# Patient Record
Sex: Male | Born: 1940 | Race: White | Hispanic: No | Marital: Married | Smoking: Never smoker
Health system: Southern US, Community
[De-identification: ages and names within clinical notes are randomized; demographics above are authoritative.]

## PROBLEM LIST (undated history)

## (undated) DIAGNOSIS — E1159 Type 2 diabetes mellitus with other circulatory complications: Secondary | ICD-10-CM

## (undated) DIAGNOSIS — I10 Essential (primary) hypertension: Secondary | ICD-10-CM

## (undated) DIAGNOSIS — E119 Type 2 diabetes mellitus without complications: Secondary | ICD-10-CM

## (undated) DIAGNOSIS — J302 Other seasonal allergic rhinitis: Secondary | ICD-10-CM

## (undated) DIAGNOSIS — N189 Chronic kidney disease, unspecified: Secondary | ICD-10-CM

## (undated) DIAGNOSIS — I152 Hypertension secondary to endocrine disorders: Secondary | ICD-10-CM

## (undated) DIAGNOSIS — G43909 Migraine, unspecified, not intractable, without status migrainosus: Secondary | ICD-10-CM

## (undated) DIAGNOSIS — Z9841 Cataract extraction status, right eye: Secondary | ICD-10-CM

## (undated) DIAGNOSIS — E785 Hyperlipidemia, unspecified: Secondary | ICD-10-CM

## (undated) DIAGNOSIS — H35379 Puckering of macula, unspecified eye: Secondary | ICD-10-CM

## (undated) DIAGNOSIS — H43812 Vitreous degeneration, left eye: Secondary | ICD-10-CM

## (undated) HISTORY — DX: Puckering of macula, unspecified eye: H35.379

## (undated) HISTORY — DX: Cataract extraction status, right eye: Z98.41

## (undated) HISTORY — DX: Hypertension secondary to endocrine disorders: I15.2

## (undated) HISTORY — DX: Other seasonal allergic rhinitis: J30.2

## (undated) HISTORY — DX: Type 2 diabetes mellitus without complications: E11.9

## (undated) HISTORY — DX: Vitreous degeneration, left eye: H43.812

## (undated) HISTORY — PX: OTHER SURGICAL HISTORY: SHX169

## (undated) HISTORY — DX: Type 2 diabetes mellitus with other circulatory complications: E11.59

## (undated) HISTORY — DX: Hyperlipidemia, unspecified: E78.5

## (undated) HISTORY — DX: Migraine, unspecified, not intractable, without status migrainosus: G43.909

## (undated) HISTORY — PX: TONSILLECTOMY AND ADENOIDECTOMY: SUR1326

## (undated) HISTORY — PX: TONSILLECTOMY: SUR1361

## (undated) HISTORY — PX: CATARACT EXTRACTION, BILATERAL: SHX1313

## (undated) HISTORY — DX: Essential (primary) hypertension: I10

## (undated) HISTORY — PX: TOOTH EXTRACTION: SUR596

## (undated) HISTORY — DX: Chronic kidney disease, unspecified: N18.9

---

## 2016-06-06 ENCOUNTER — Encounter: Payer: Self-pay | Admitting: *Deleted

## 2016-06-06 ENCOUNTER — Encounter: Payer: Medicare Other | Attending: Internal Medicine | Admitting: *Deleted

## 2016-06-06 VITALS — BP 108/70 | Ht 71.0 in | Wt 155.4 lb

## 2016-06-06 DIAGNOSIS — E119 Type 2 diabetes mellitus without complications: Secondary | ICD-10-CM | POA: Diagnosis not present

## 2016-06-06 DIAGNOSIS — Z794 Long term (current) use of insulin: Secondary | ICD-10-CM

## 2016-06-06 DIAGNOSIS — Z713 Dietary counseling and surveillance: Secondary | ICD-10-CM | POA: Diagnosis not present

## 2016-06-06 DIAGNOSIS — Z6821 Body mass index (BMI) 21.0-21.9, adult: Secondary | ICD-10-CM | POA: Diagnosis not present

## 2016-06-06 NOTE — Progress Notes (Signed)
Diabetes Self-Management Education  Visit Type: First/Initial  Appt. Start Time: 1335 Appt. End Time: 1520  06/06/2016  [name], identified by name and date of birth, is a 75 y.o. male with a diagnosis of Diabetes: Type 2.   ASSESSMENT  Blood pressure 108/70, height 5\' 11"  (1.803 m), weight 155 lb 6.4 oz (70.5 kg). Body mass index is 21.67 kg/m.      Diabetes Self-Management Education - 06/06/16 1543      Visit Information   Visit Type First/Initial     Initial Visit   Diabetes Type Type 2   Are you currently following a meal plan? Yes   What type of meal plan do you follow? 3 meals/3 snacks per day - 2400 calorie diet   Are you taking your medications as prescribed? Yes   Date Diagnosed 23 years ago     Health Coping   How would you rate your overall health? Good     Psychosocial Assessment   Patient Belief/Attitude about Diabetes Motivated to manage diabetes   Self-care barriers None   Self-management support Doctor's office;Family   Other persons present Spouse/SO   Patient Concerns Nutrition/Meal planning;Medication;Problem Solving;Monitoring;Glycemic Control   Special Needs None   Preferred Learning Style Auditory;Visual;Hands on   [place] in progress   How often do you need to have someone help you when you read instructions, pamphlets, or other written materials from your doctor or pharmacy? 1 - Never   What is the last grade level you completed in school? 4 years college     Pre-Education Assessment   Patient understands the diabetes disease and treatment process. Needs Review   Patient understands incorporating nutritional management into lifestyle. Needs Review   Patient undertands incorporating physical activity into lifestyle. Demonstrates understanding / competency   Patient understands using medications safely. Needs Review   Patient understands monitoring blood glucose, interpreting and using results Needs Review   Patient  understands prevention, detection, and treatment of acute complications. Needs Review   Patient understands prevention, detection, and treatment of chronic complications. Needs Review   Patient understands how to develop strategies to address psychosocial issues. Needs Review   Patient understands how to develop strategies to promote health/change behavior. Needs Review     Complications   Last HgB A1C per patient/outside source 7.4 %  03/30/16   How often do you check your blood sugar? 1-2 times/day   Fasting Blood glucose range (mg/dL) 70-129  Pt reports FBG's 80-130 mg/dL   Postprandial Blood glucose range (mg/dL) 70-129;130-179  Pt reports pre-lunch and pre-supper 80-190's mg/dL.    Have you had a dilated eye exam in the past 12 months? Yes   Have you had a dental exam in the past 12 months? Yes   Are you checking your feet? Yes   How many days per week are you checking your feet? 7     Dietary Intake   Breakfast oatmeal with fruit, milk and nuts; cereal and milk with nuts   Snack (morning) fruit, crackers   Lunch tomato sandwich, cuccumbers, onions, bread, pork   Snack (afternoon) fruit, cereal and milk   Dinner tenderloin, bread, okra, no sugar ice cream   Snack (evening) oatmeal, cereal and milk, fruit, peanuts   Beverage(s) water, diet soda     Exercise   Exercise Type Moderate (swimming / aerobic walking);Light (walking / raking leaves)  walking, weight machine, swimming   How many days per week to you exercise? 6  How many minutes per day do you exercise? 50   Total minutes per week of exercise 300     Patient Education   Previous Diabetes Education Yes (please comment)  1997 at [place] [facility]  Explored patient's options for treatment of their diabetes   Nutrition management  Carbohydrate counting;Food label reading, portion sizes and measuring food.;Reviewed blood glucose goals for pre and post meals and how to evaluate the patients' food  intake on their blood glucose level.   Physical activity and exercise  Role of exercise on diabetes management, blood pressure control and cardiac health.   Medications Taught/reviewed insulin injection, site rotation, insulin storage and needle disposal.;Reviewed patients medication for diabetes, action, purpose, timing of dose and side effects.   Monitoring Taught/discussed recording of test results and interpretation of SMBG.;Identified appropriate SMBG and/or A1C goals.   Acute complications Taught treatment of hypoglycemia - the 15 rule.   Chronic complications Nephropathy, what it is, prevention of, the use of ACE, ARB's and early detection of through urine microalbumia.   Psychosocial adjustment Identified and addressed patients feelings and concerns about diabetes     Individualized Goals (developed by patient)   Reducing Risk Improve blood sugars Decrease medications Prevent diabetes complications     Outcomes   Expected Outcomes Demonstrated interest in learning. Expect positive outcomes   Future DMSE 2 wks      Individualized Plan for Diabetes Self-Management Training:   Learning Objective:  Patient will have a greater understanding of diabetes self-management. Patient education plan is to attend individual and/or group sessions per assessed needs and concerns.   Plan:   Patient Instructions  Check blood sugars 2 x day before breakfast and 2 hrs after a meal every day Exercise: Continue program for  45-60  minutes  5-7 days a week Eat 3 meals day,  2-3  snacks a day Space meals 4-6 hours apart Complete 3 Day Food Record and bring to next appt Bring blood sugar records to the next appointment Carry fast acting glucose and a snack at all times Return for appointment  Tuesday June 21, 2016 at 1:30 with Jaclyn Shaggy (dietitian)   Expected Outcomes:  Demonstrated interest in learning. Expect positive outcomes  Education material provided:  General Meal Planning  Guidelines Simple Meal Plan 3 Day Food Record Glucose tablets Symptoms, causes and treatments of Hypoglycemia  If problems or questions, patient to contact team via:   Johny Drilling, [place], [place], CDE [phone]  Future DSME appointment: 2 wks   Tuesday June 21, 2016 with dietitian

## 2016-06-06 NOTE — Patient Instructions (Addendum)
Check blood sugars 2 x day before breakfast and 2 hrs after a meal every day  Exercise: Continue program for  45-60  minutes  5-7 days a week  Eat 3 meals day,  2-3  snacks a day Space meals 4-6 hours apart Complete 3 Day Food Record and bring to next appt  Bring blood sugar records to the next appointment  Carry fast acting glucose and a snack at all times  Return for appointment  Tuesday June 21, 2016 at 1:30 with Jaclyn Shaggy (dietitian)

## 2016-06-21 ENCOUNTER — Encounter: Payer: Self-pay | Admitting: Dietician

## 2016-06-21 ENCOUNTER — Encounter: Payer: Medicare Other | Attending: Internal Medicine | Admitting: Dietician

## 2016-06-21 VITALS — BP 122/72 | Ht 71.0 in | Wt 153.0 lb

## 2016-06-21 DIAGNOSIS — Z713 Dietary counseling and surveillance: Secondary | ICD-10-CM | POA: Diagnosis present

## 2016-06-21 DIAGNOSIS — Z6821 Body mass index (BMI) 21.0-21.9, adult: Secondary | ICD-10-CM | POA: Diagnosis not present

## 2016-06-21 DIAGNOSIS — Z794 Long term (current) use of insulin: Secondary | ICD-10-CM

## 2016-06-21 DIAGNOSIS — E119 Type 2 diabetes mellitus without complications: Secondary | ICD-10-CM

## 2016-06-21 NOTE — Progress Notes (Signed)
Diabetes Self-Management Education  Visit Type:  Follow-up  Appt. Start Time: 1330 Appt. End Time: 1430  06/21/2016  [name], identified by name and date of birth, is a 75 y.o. male with a diagnosis of Diabetes:  Marland Kitchen  Type 2 Patient states that his present weight is his preferred weight. He reports he has followed a 2400 calorie diet since his initial diagnosis approximately 23 years ago. He has experienced decreased appetite since starting the medication, trulicity and he has lost 2 lbs since his initial visit 2 weeks ago. He brought food records which indicate his carbohydrate servings are within recommended ranges. He often eats 2 servings of fruit with meals as well as including fruit with his cereal; sometimes 2 fruits. His FBG's are within recommended ranges but many of his post meal readings are greater than 200. He reports if he is active after meals, the numbers are usually lower. In general, he does a nice job of balancing his meals. Reviewed his meals, making suggestions on balance of types of carbohydrate and also reviewed his meal plan in general. ASSESSMENT  Blood pressure 122/72, height 5\' 11"  (1.803 m), weight 153 lb (69.4 kg). Body mass index is 21.34 kg/m.       Diabetes Self-Management Education - [id number]      Complications   Last HgB A1C per patient/outside source 7.4 %   How often do you check your blood sugar? 3-4 times/day   Fasting Blood glucose range (mg/dL) 70-129   Postprandial Blood glucose range (mg/dL) 130-179;180-200;>200   Have you had a dilated eye exam in the past 12 months? Yes   Have you had a dental exam in the past 12 months? Yes   Are you checking your feet? Yes   How many days per week are you checking your feet? 7     Dietary Intake   Breakfast 1 cup 1%milk, 1 cup oatmeal, 4 strawberries, 1/2 banana, 2 Tbsp unsalted peanuts or 1 cup cereal/milk, 1-2 fruits, 2tbsp peanuts   Snack (morning) 1/2 apple, 6-8 ritz crackers or 1/2 med.  apple, 4 crackers   Lunch Ex. 1 (4oz) pork chop, 1/2 cup black beans, 2 cups cantaloupe, 1 homemade biscuit, 1 tomato, 1/2 banana    Snack (afternoon) 6 crackers, peanut butter or string cheese, pretzels   Dinner baked salmon, 3 small pieces cornbread, 1/2 cup cabbage, 3 Tbsp reduced sugar preserves   Snack (evening) 1 piece bread, cheese or 1.5 cups popcorn, 1/4 cup English walnuts   Beverage(s) water-6-8 cups per day; 12 oz. diet soda, 2 x/day     Exercise   Exercise Type Light (walking / raking leaves)   How many days per week to you exercise? 6   How many minutes per day do you exercise? 45   Total minutes per week of exercise 270     Patient Education   Nutrition management  Role of diet in the treatment of diabetes and the relationship between the three main macronutrients and blood glucose level;Food label reading, portion sizes and measuring food.;Carbohydrate counting;Reviewed blood glucose goals for pre and post meals and how to evaluate the patients' food intake on their blood glucose level.;Meal options for control of blood glucose level and chronic complications.     Post-Education Assessment   Patient understands the diabetes disease and treatment process. Demonstrates understanding / competency   Patient understands incorporating nutritional management into lifestyle. Demonstrates understanding / competency   Patient undertands incorporating physical activity into lifestyle.  Demonstrates understanding / competency   Patient understands using medications safely. Demonstrates understanding / competency   Patient understands monitoring blood glucose, interpreting and using results Demonstrates understanding / competency   Patient understands prevention, detection, and treatment of acute complications. Demonstrates understanding / competency   Patient understands prevention, detection, and treatment of chronic complications. Demonstrates understanding / competency   Patient  understands how to develop strategies to address psychosocial issues. Demonstrates understanding / competency   Patient understands how to develop strategies to promote health/change behavior. Demonstrates understanding / competency     Outcomes   Program Status Completed      Learning Objective:  Patient will have a greater understanding of diabetes self-management. Patient education plan is to attend individual and/or group sessions per assessed needs and concerns.   Plan:  To include only one fruit serving per meal and to avoid dried fruit. No fruit when eating dry cereal/milk. To average 4 servings of carbohydrate per meal with 1-2 servings of carbohydrate for snacks. To include a protein food if having a fruit for a snack. If loses any more weight, to be sure each snack has 2 servings of carbohydrate + protein. Also, to add more healthy fats to meals.   Expected Outcomes:  Demonstrated interest in learning. Expect positive outcomes  Education material provided:Planning a Balanced Meal If problems or questions, patient to contact team via:  Karolee Stamps, RD,CDE  [phone]  Future DSME appointment: - PRN

## 2016-08-24 ENCOUNTER — Encounter (INDEPENDENT_AMBULATORY_CARE_PROVIDER_SITE_OTHER): Payer: Self-pay

## 2016-08-24 ENCOUNTER — Ambulatory Visit (INDEPENDENT_AMBULATORY_CARE_PROVIDER_SITE_OTHER): Payer: Medicare Other | Admitting: Cardiovascular Disease

## 2016-08-24 ENCOUNTER — Encounter: Payer: Self-pay | Admitting: Cardiovascular Disease

## 2016-08-24 VITALS — BP 132/70 | HR 68 | Ht 71.0 in | Wt 150.8 lb

## 2016-08-24 DIAGNOSIS — R6884 Jaw pain: Secondary | ICD-10-CM | POA: Diagnosis not present

## 2016-08-24 DIAGNOSIS — IMO0001 Reserved for inherently not codable concepts without codable children: Secondary | ICD-10-CM | POA: Insufficient documentation

## 2016-08-24 DIAGNOSIS — R079 Chest pain, unspecified: Secondary | ICD-10-CM | POA: Diagnosis not present

## 2016-08-24 DIAGNOSIS — I10 Essential (primary) hypertension: Secondary | ICD-10-CM | POA: Insufficient documentation

## 2016-08-24 DIAGNOSIS — E785 Hyperlipidemia, unspecified: Secondary | ICD-10-CM

## 2016-08-24 DIAGNOSIS — N182 Chronic kidney disease, stage 2 (mild): Secondary | ICD-10-CM

## 2016-08-24 DIAGNOSIS — E1165 Type 2 diabetes mellitus with hyperglycemia: Secondary | ICD-10-CM

## 2016-08-24 NOTE — Patient Instructions (Addendum)
Medication Instructions:   No medication changes made  Labwork:  No new labs needed  Testing/Procedures:  Please research CT coronary calcium score  Phone number to schedule:  I recommend watching educational videos on topics of interest to you at:       [email or web address]  Enter code: HEARTCARE    Follow-Up: It was a pleasure seeing you in the office today. Please call us if you have new issues that need to be addressed before your next appt.  [phone]  Your physician wants you to follow-up in: 12 months.  You will receive a reminder letter in the mail two months in advance. If you don't receive a letter, please call our office to schedule the follow-up appointment.  If you need a refill on your cardiac medications before your next appointment, please call your pharmacy.

## 2016-08-24 NOTE — Progress Notes (Signed)
Cardiology Office Note  Date:  08/24/2016   ID:  Terry Vazquez, DOB Jul 20, 1941, MRN [id number]  PCP:  Terry Hartigan, MD   Chief Complaint  Patient presents with  . New Patient (Initial Visit)    increase sob    HPI:  [name] is a 75 year old gentleman with history of type 2 diabetes, hypertension, hyperlipidemia, renal dysfunction, who presents by referral from Dr. Ellison Vazquez for evaluation of cough, left side chest pain, left jaw pain  He reports that he is a active swimmer, does laps Sometimes when he swims he develops left-sided jaw pain Wonders if it could be from chest issues or from turning his head to the right when he swims  Also reports having periodic left side chest discomfort, sometimes relieved with a cough, sometimes goes away on its own Typically does not stop his exercise We'll sometimes develop when he is walking  Goes walking 3 miles per day, takes him 45 minutes  Often will feel tired, does not know if that is from overactivity Some shortness of breath, fatigue climbing ladders while he was cleaning gutters  Reports he is been on a cholesterol medication for approximately 20 years Diabetes since age 77  Lab work reviewed with him HBA1C 7.4 Creatinine 1.3  Previous evaluation years ago, [facility]" [facility] 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  PSH:    Past Surgical History:  Procedure Laterality Date  . CATARACT EXTRACTION, BILATERAL    . TONSILLECTOMY AND ADENOIDECTOMY      Current Outpatient Prescriptions  Medication Sig Dispense Refill  . aspirin EC 81 MG tablet Take 81 mg by mouth daily.     Marland Kitchen atorvastatin (LIPITOR) 20 MG tablet Take 20 mg by mouth daily.     . benazepril (LOTENSIN) 20  MG tablet Take 20 mg by mouth daily.     . Dulaglutide 0.75 MG/0.5ML SOPN Inject 0.75 mg into the skin once a week.     Marland Kitchen glucosamine-chondroitin ([facility] GLUCOSAMINE-CHONDROITIN) 500-400 MG tablet Take 1 tablet by mouth daily.     . insulin glargine (LANTUS) 100 unit/mL SOPN Inject 18 Units into the skin at bedtime.    . Loratadine 10 MG CAPS Take 10 mg by mouth daily.     . metFORMIN (GLUCOPHAGE-XR) 500 MG 24 hr tablet Take 500 mg by mouth 2 (two) times daily.     . Multiple Vitamins-Minerals (CENTRUM SILVER PO) Take 1 tablet by mouth daily.     No current facility-administered medications for this visit.      Allergies:   Patient has no known allergies.   Social History:  The patient  reports that he has never smoked. He has never used smokeless tobacco. He reports that he does not drink alcohol.   Family History:   family history includes Diabetes in his brother and mother; Heart failure in his brother and father; Kidney disease in his father; Parkinson's disease in his mother; Rheum arthritis in his sister.    Review of Systems: Review of Systems  Constitutional: Positive for malaise/fatigue.  HENT:       Left jaw pain  Respiratory: Positive for shortness of breath.   Cardiovascular: Negative.   Gastrointestinal: Negative.   Musculoskeletal: Negative.   Neurological: Negative.  Psychiatric/Behavioral: Negative.   All other systems reviewed and are negative.    PHYSICAL EXAM: VS:  BP 132/70   Pulse 68   Ht 5\' 11"  (1.803 m)   Wt 150 lb 12.8 oz (68.4 kg)   BMI 21.03 kg/m  , BMI Body mass index is 21.03 kg/m. GEN: Well nourished, well developed, in no acute distress  HEENT: normal  Neck: no JVD, carotid bruits, or masses Cardiac: RRR; no murmurs, rubs, or gallops,no edema  Respiratory:  clear to auscultation bilaterally, normal work of breathing GI: soft, nontender, nondistended, + BS MS: no deformity or atrophy  Skin: warm and dry, no rash Neuro:  Strength and  sensation are intact Psych: euthymic mood, full affect    Recent Labs: No results found for requested labs within last 8760 hours.    Lipid Panel No results found for: CHOL, HDL, LDLCALC, TRIG    Wt Readings from Last 3 Encounters:  08/24/16 150 lb 12.8 oz (68.4 kg)  06/21/16 153 lb (69.4 kg)  06/06/16 155 lb 6.4 oz (70.5 kg)       ASSESSMENT AND PLAN:  Chest pain, unspecified type - Plan: EKG 12-Lead Atypical type chest pain, Sometimes at rest, rarely with exertion, relieved with coughing Discussed various options for workup including stress testing, CT coronary calcium screening After long discussion, he prefers CT coronary calcium score for risk stratification If this is markedly elevated, would then proceed with additional testing  Essential hypertension - Plan: EKG 12-Lead Blood pressure is well controlled on today's visit. No changes made to the medications.  Hyperlipidemia, unspecified hyperlipidemia type - Plan: CT CARDIAC SCORING Cholesterol is at goal on the current lipid regimen. No changes to the medications were made.. If CT coronary calcium score is markedly elevated, will need LDL less than 70  Jaw pain Atypical left jaw pain, likely unrelated to cardiac issues though after long discussion will order CT coronary calcium scoring for risk stratification  Uncontrolled type 2 diabetes mellitus without complication, without long-term current use of insulin (Terry [facility]az[facility]) We have encouraged continued exercise, careful diet management in an effort to lose weight.  Chronic renal impairment, stage 2 (mild) Creatinine within normal range currently   Total encounter time more than 45 minutes  Greater than 50% was spent in counseling and coordination of care with the patient   Disposition:   F/U  12 months   Orders Placed This Encounter  Procedures  . CT CARDIAC SCORING  . EKG 12-Lead     Signed, Esmond Plants, M.D., Ph.D. 08/24/2016  Terry Vazquez, [place]

## 2016-10-12 ENCOUNTER — Ambulatory Visit (INDEPENDENT_AMBULATORY_CARE_PROVIDER_SITE_OTHER)
Admission: RE | Admit: 2016-10-12 | Discharge: 2016-10-12 | Disposition: A | Discharge status: Home / Self Care | Payer: Self-pay | Source: Ambulatory Visit | Attending: Cardiovascular Disease | Admitting: Cardiovascular Disease

## 2016-10-12 DIAGNOSIS — E785 Hyperlipidemia, unspecified: Secondary | ICD-10-CM

## 2016-10-12 IMAGING — CT CT HEART SCORING
2 series · 16 of 20 positions shown, 18 images · non-contrast
Comparison: None.

CLINICAL DATA: Risk stratification

EXAM:
Coronary Calcium Score
TECHNIQUE: The patient was scanned on a Siemens Sensation 16 slice scanner.
Axial non-contrast 3mm slices were carried out through the heart.
The data set was analyzed on a dedicated work station and scored
using the Agatson method.

[Series 2: casc 3.0 i36f 2 bestdiast 68 % · axial · 0.29mm/px · z∈[-263,-137]mm · 8 of 54 slices shown, 10 images]
[im 6/54  vessel]
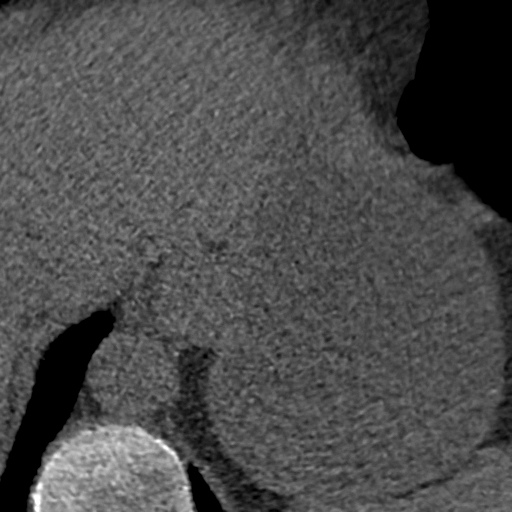
[im 6/54  lung]
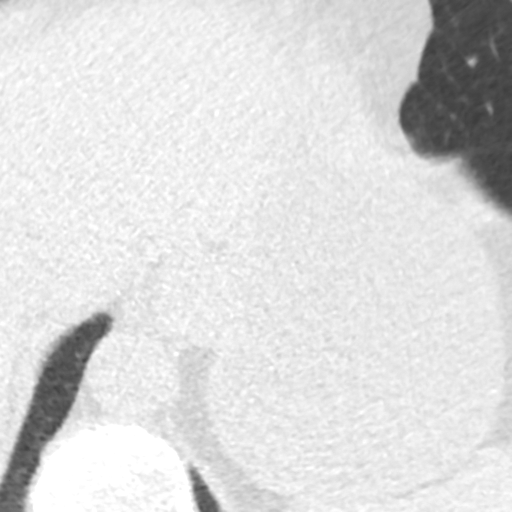
[im 12/54  vessel]
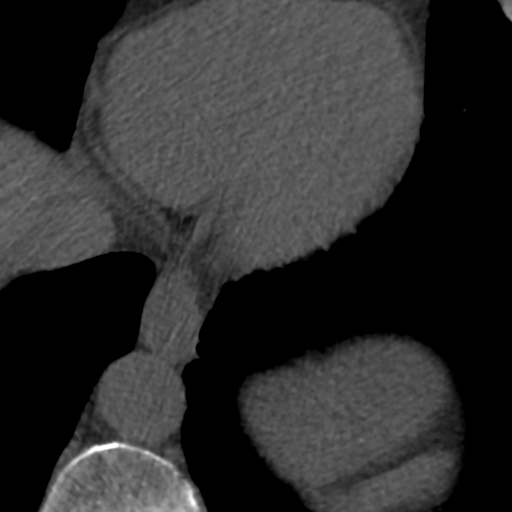
[im 18/54  vessel]
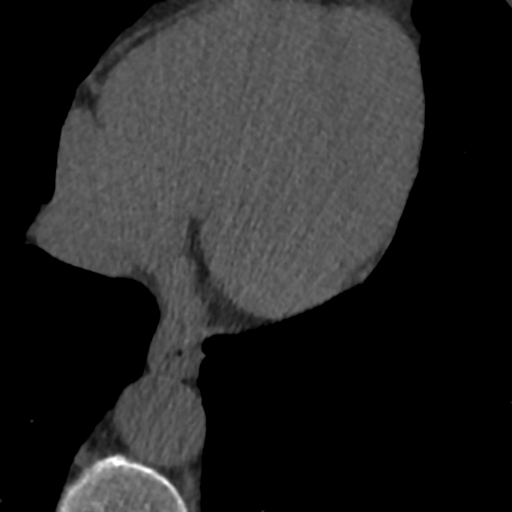
[im 24/54  vessel]
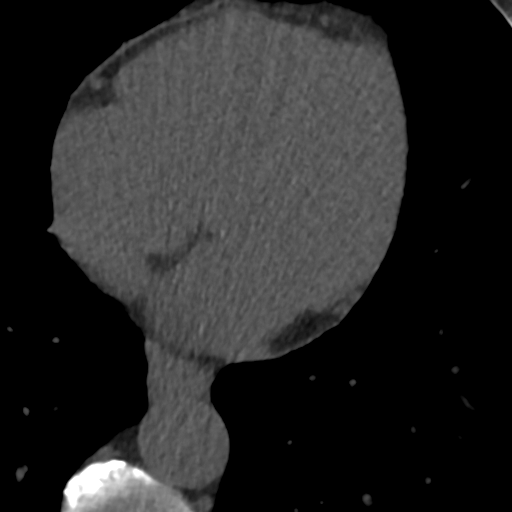
[im 30/54  vessel]
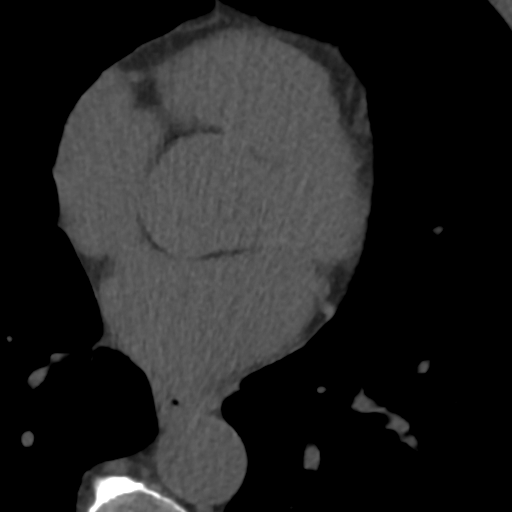
[im 30/54  lung]
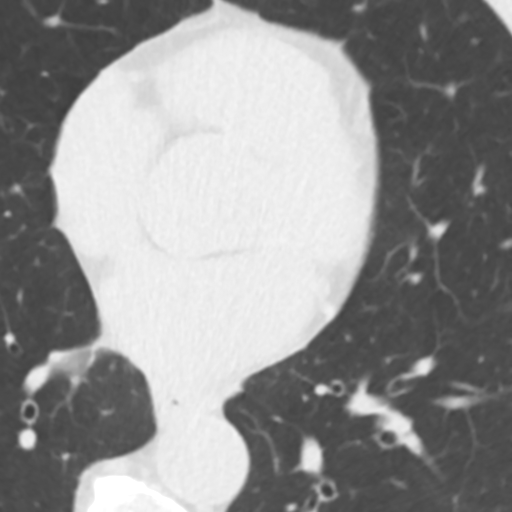
[im 36/54  vessel]
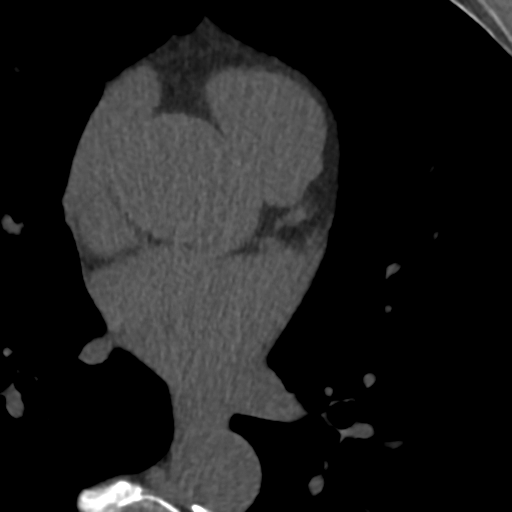
[im 42/54  vessel]
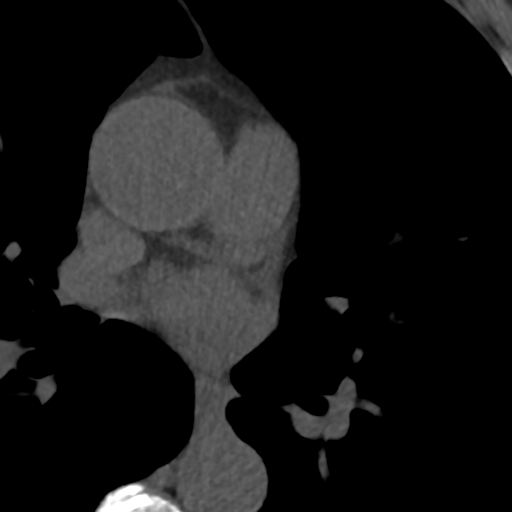
[im 48/54  vessel]
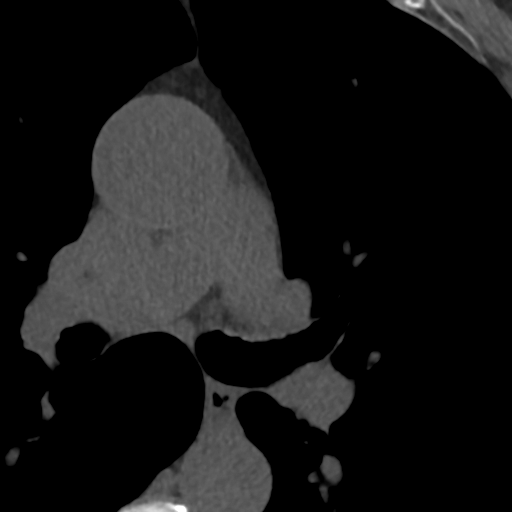

[Series 4: lung st 68 % · axial · 0.63mm/px · z∈[-263,-137]mm · 8 of 54 slices shown]
[im 6/54  lung]
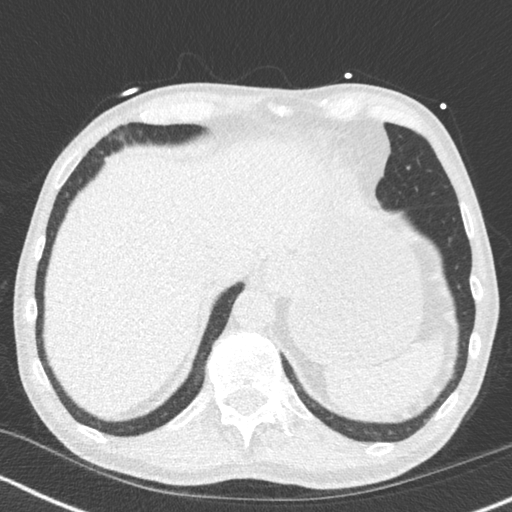
[im 12/54  lung]
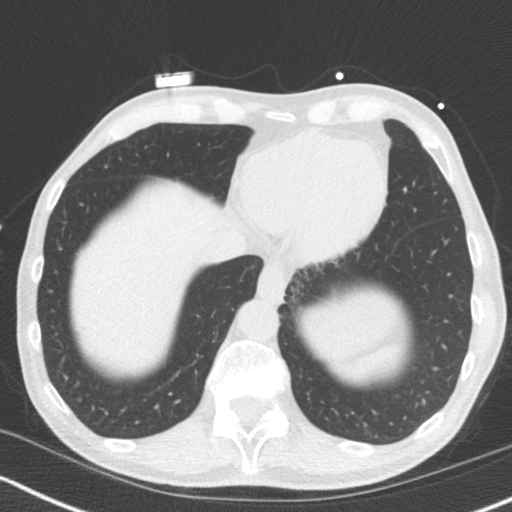
[im 18/54  lung]
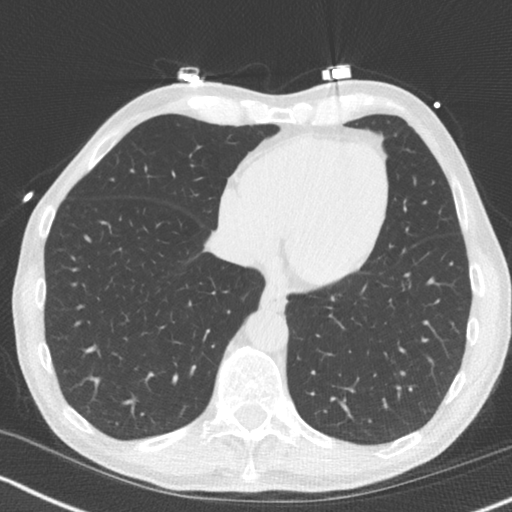
[im 24/54  lung]
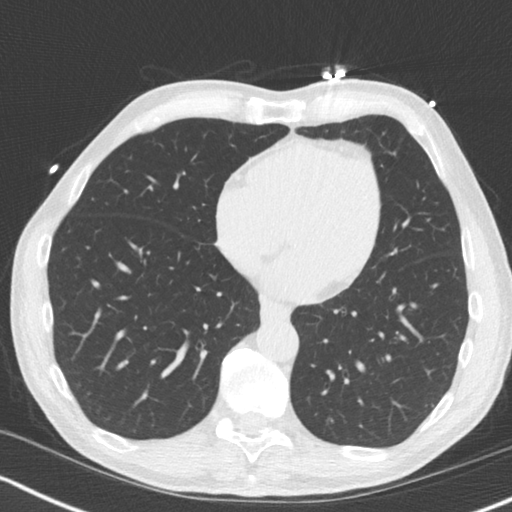
[im 30/54  lung]
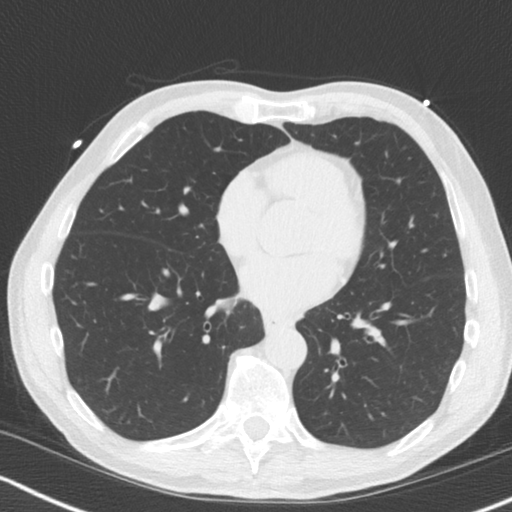
[im 36/54  lung]
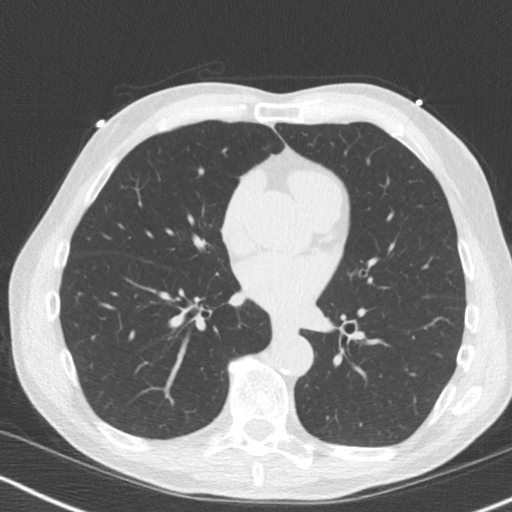
[im 42/54  lung]
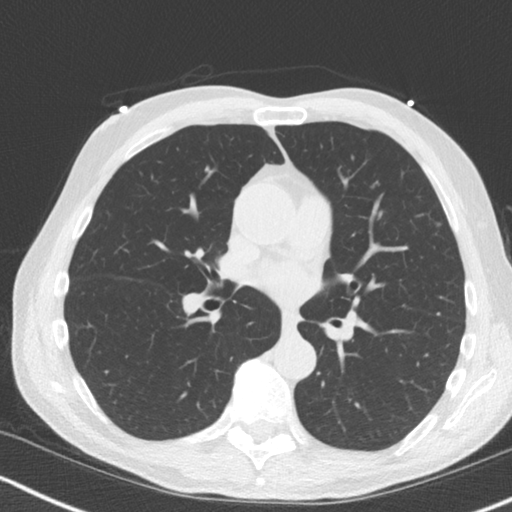
[im 48/54  lung]
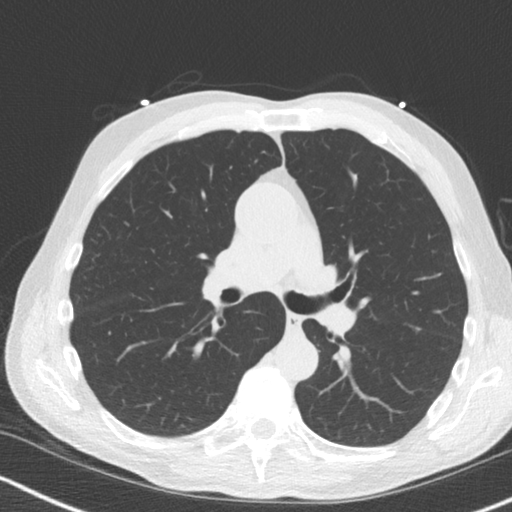

[16 of 20 positions shown; findings below may reference images not displayed]

FINDINGS: Non-cardiac: No significant non cardiac findings on limited lung and
soft tissue windows. See separate report from [REDACTED].

Ascending Aorta:

Pericardium: Normal

Coronary arteries:  Calcium noted in left main and proximal LAD
IMPRESSION: Coronary calcium score of 80. This was less than 25th percentile for
age and sex matched control.

ALAIN EVRARD

EXAM:
OVER-READ INTERPRETATION  CT CHEST

The following report is an over-read performed by radiologist Dr.
ALAIN EVRARD [REDACTED] on [DATE]. This over-read
does not include interpretation of cardiac or coronary anatomy or
pathology. The Coronary calcium score interpretation by the
cardiologist is attached.
FINDINGS: Cardiovascular: Heart is normal size. Aorta is normal caliber.

Mediastinum/Nodes: No adenopathy in the visualized lower mediastinum
or hila

Lungs/Pleura: Visualized lungs are clear.  No effusions.

Upper Abdomen: Imaging into the upper abdomen shows no acute
findings.

Musculoskeletal: No acute bony abnormality or focal bone lesion.
IMPRESSION: No acute or significant extra cardiac abnormality.

## 2019-01-30 ENCOUNTER — Telehealth: Payer: Self-pay

## 2019-01-30 NOTE — Telephone Encounter (Signed)
Called patient from recall list.  No answer. LMOV. Patient is past due for a 12 month follow up.  Patient was last seen in 2017 Patient will need an Evisit.

## 2019-01-30 NOTE — Telephone Encounter (Signed)
Patient declined to schedule from recall. He has meds rxd at pcp office. Per patient request deleting recall. Patient aware in 08/2019 he will be considered a new patient if calling back and we may not be able to see him for 6-12 months in office.  He understands and appreciates the call.

## 2021-09-28 ENCOUNTER — Encounter: Payer: Self-pay | Admitting: *Deleted

## 2021-10-06 ENCOUNTER — Telehealth: Payer: Self-pay | Admitting: *Deleted

## 2021-10-06 NOTE — Telephone Encounter (Signed)
Attempted to reach NP prior to his apt. No answer. Left vm on home line reminding pt of his apt.

## 2021-10-07 ENCOUNTER — Inpatient Hospital Stay: Payer: Medicare PPO | Attending: Oncology | Admitting: Oncology

## 2021-10-07 ENCOUNTER — Encounter: Payer: Self-pay | Admitting: Oncology

## 2021-10-07 ENCOUNTER — Other Ambulatory Visit: Payer: Self-pay

## 2021-10-07 ENCOUNTER — Inpatient Hospital Stay: Payer: Medicare PPO

## 2021-10-07 VITALS — BP 142/70 | HR 72 | Temp 96.7°F | Resp 18 | Wt 144.8 lb

## 2021-10-07 DIAGNOSIS — C9000 Multiple myeloma not having achieved remission: Secondary | ICD-10-CM | POA: Insufficient documentation

## 2021-10-07 DIAGNOSIS — E1142 Type 2 diabetes mellitus with diabetic polyneuropathy: Secondary | ICD-10-CM | POA: Insufficient documentation

## 2021-10-07 DIAGNOSIS — E1122 Type 2 diabetes mellitus with diabetic chronic kidney disease: Secondary | ICD-10-CM | POA: Diagnosis not present

## 2021-10-07 DIAGNOSIS — D472 Monoclonal gammopathy: Secondary | ICD-10-CM | POA: Insufficient documentation

## 2021-10-07 DIAGNOSIS — R778 Other specified abnormalities of plasma proteins: Secondary | ICD-10-CM

## 2021-10-07 DIAGNOSIS — N1831 Chronic kidney disease, stage 3a: Secondary | ICD-10-CM | POA: Insufficient documentation

## 2021-10-07 DIAGNOSIS — I129 Hypertensive chronic kidney disease with stage 1 through stage 4 chronic kidney disease, or unspecified chronic kidney disease: Secondary | ICD-10-CM | POA: Diagnosis not present

## 2021-10-07 LAB — CBC WITH DIFFERENTIAL/PLATELET
Abs Immature Granulocytes: 0.02 10*3/uL (ref 0.00–0.07)
Basophils Absolute: 0 10*3/uL (ref 0.0–0.1)
Basophils Relative: 1 %
Eosinophils Absolute: 0.1 10*3/uL (ref 0.0–0.5)
Eosinophils Relative: 2 %
HCT: 39.9 % (ref 39.0–52.0)
Hemoglobin: 13.5 g/dL (ref 13.0–17.0)
Immature Granulocytes: 0 %
Lymphocytes Relative: 21 %
Lymphs Abs: 1.3 10*3/uL (ref 0.7–4.0)
MCH: 30.4 pg (ref 26.0–34.0)
MCHC: 33.8 g/dL (ref 30.0–36.0)
MCV: 89.9 fL (ref 80.0–100.0)
Monocytes Absolute: 0.4 10*3/uL (ref 0.1–1.0)
Monocytes Relative: 7 %
Neutro Abs: 4.4 10*3/uL (ref 1.7–7.7)
Neutrophils Relative %: 69 %
Platelets: 185 10*3/uL (ref 150–400)
RBC: 4.44 MIL/uL (ref 4.22–5.81)
RDW: 12.8 % (ref 11.5–15.5)
WBC: 6.3 10*3/uL (ref 4.0–10.5)
nRBC: 0 % (ref 0.0–0.2)

## 2021-10-07 LAB — COMPREHENSIVE METABOLIC PANEL
ALT: 27 U/L (ref 0–44)
AST: 31 U/L (ref 15–41)
Albumin: 3.9 g/dL (ref 3.5–5.0)
Alkaline Phosphatase: 49 U/L (ref 38–126)
Anion gap: 10 (ref 5–15)
BUN: 25 mg/dL — ABNORMAL HIGH (ref 8–23)
CO2: 25 mmol/L (ref 22–32)
Calcium: 9.2 mg/dL (ref 8.9–10.3)
Chloride: 102 mmol/L (ref 98–111)
Creatinine, Ser: 1.18 mg/dL (ref 0.61–1.24)
GFR, Estimated: >60 mL/min (ref >60)
Glucose, Bld: 218 mg/dL — ABNORMAL HIGH (ref 70–99)
Potassium: 4.1 mmol/L (ref 3.5–5.1)
Sodium: 137 mmol/L (ref 135–145)
Total Bilirubin: 0.6 mg/dL (ref 0.3–1.2)
Total Protein: 6.8 g/dL (ref 6.5–8.1)

## 2021-10-08 LAB — KAPPA/LAMBDA LIGHT CHAINS
Kappa free light chain: 162.6 mg/L — ABNORMAL HIGH (ref 3.3–19.4)
Kappa, lambda light chain ratio: 4.48 — ABNORMAL HIGH (ref 0.26–1.65)
Lambda free light chains: 36.3 mg/L — ABNORMAL HIGH (ref 5.7–26.3)

## 2021-10-08 LAB — TECHNOLOGIST SMEAR REVIEW: Plt Morphology: ADEQUATE

## 2021-10-08 NOTE — Progress Notes (Signed)
Hematology/Oncology Consult note [phone] [phone]      Patient Care Team: Vladimir Crofts, MD as PCP - General (Neurology) Vladimir Crofts, MD as Consulting Physician (Neurology) Earlie Server, MD as Consulting Physician (Hematology and Oncology)  REFERRING PROVIDER: Vladimir Crofts, MD  CHIEF COMPLAINTS/REASON FOR VISIT:  Evaluation of abnormal SPEP  HISTORY OF PRESENTING ILLNESS:   Terry Vazquez is a  80 y.o.  male with PMH listed below was seen in consultation at the request of  Vladimir Crofts, MD  for evaluation of abnormal SPEP.  Patient recently was seen by neurology for left leg weakness, back pain, diabetic polyneuropathy.  He had work up done.  08/20/2021 SPEP showed M spike of 0.5g/dl, IgG kappa.  09/14/2021 Free kappa light chain 132.9, lamda light chain 36.9, kappa/lamda ratio 3.6 He was referred to hematology for further evaluation.   Review of Systems  Constitutional:  Negative for appetite change, chills, fatigue, fever and unexpected weight change.  HENT:   Negative for hearing loss and voice change.   Eyes:  Negative for eye problems and icterus.  Respiratory:  Negative for chest tightness, cough and shortness of breath.   Cardiovascular:  Negative for chest pain and leg swelling.  Gastrointestinal:  Negative for abdominal distention and abdominal pain.  Endocrine: Negative for hot flashes.  Genitourinary:  Negative for difficulty urinating, dysuria and frequency.   Musculoskeletal:  Negative for arthralgias.  Skin:  Negative for itching and rash.  Neurological:  Positive for extremity weakness. Negative for light-headedness and numbness.  Hematological:  Negative for adenopathy. Does not bruise/bleed easily.  Psychiatric/Behavioral:  Negative for confusion.    MEDICAL HISTORY:  Past Medical History:  Diagnosis Date   Cataract extraction status of right eye    CKD (chronic kidney disease)    Diabetes mellitus without complication (HCC)     Hyperlipidemia    Hypertension    Hypertension associated with diabetes ([place])    Migraine without status migrainosus, not intractable    Other seasonal allergic rhinitis    Puckering of macula    Vitreous degeneration of left eye     SURGICAL HISTORY: Past Surgical History:  Procedure Laterality Date   CATARACT EXTRACTION, BILATERAL     Left Ring and Long Trigger Finger Release     TONSILLECTOMY     TONSILLECTOMY AND ADENOIDECTOMY     TOOTH EXTRACTION      SOCIAL HISTORY: Social History   Socioeconomic History   Marital status: Married    Spouse name: Not on file   Number of children: Not on file   Years of education: Not on file   Highest education level: Not on file  Occupational History   Not on file  Tobacco Use   Smoking status: Never   Smokeless tobacco: Never  Vaping Use   Vaping Use: Never used  Substance and Sexual Activity   Alcohol use: Never   Drug use: Never   Sexual activity: Not on file  Other Topics Concern   Not on file  Social History Narrative   Not on file   Social Determinants of Health   Financial Resource Strain: Not on file  Food Insecurity: Not on file  Transportation Needs: Not on file  Physical Activity: Not on file  Stress: Not on file  Social Connections: Not on file  Intimate Partner Violence: Not on file    FAMILY HISTORY: Family History  Problem Relation Age of Onset   Diabetes Mother  Parkinson's disease Mother    Heart failure Father    Kidney disease Father    Rheum arthritis Sister    Diabetes Brother    Heart failure Brother    Arthritis Brother    Diabetes Other        multiple cousins with diabetes    ALLERGIES:  has No Known Allergies.  MEDICATIONS:  Current Outpatient Medications  Medication Sig Dispense Refill   aspirin EC 81 MG tablet Take 81 mg by mouth daily.      atorvastatin (LIPITOR) 20 MG tablet Take 20 mg by mouth daily.      benazepril (LOTENSIN) 20 MG tablet Take 20 mg by mouth daily.       Dulaglutide 0.75 MG/0.5ML SOPN Inject 0.75 mg into the skin once a week.      glucosamine-chondroitin 500-400 MG tablet Take 1 tablet by mouth daily.      Insulin Degludec (TRESIBA FLEXTOUCH [place]) Inject 13 Units into the skin at bedtime.     Loratadine 10 MG CAPS Take 10 mg by mouth daily.      metFORMIN (GLUCOPHAGE-XR) 500 MG 24 hr tablet Take 500 mg by mouth 2 (two) times daily.      OVER THE COUNTER MEDICATION NATURAL BLACK CHERRY CONCENTRATE     No current facility-administered medications for this visit.     PHYSICAL EXAMINATION: ECOG PERFORMANCE STATUS: 1 - Symptomatic but completely ambulatory Vitals:   10/07/21 1518  BP: (!) 142/70  Pulse: 72  Resp: 18  Temp: (!) 96.7 F (35.9 C)   Filed Weights   10/07/21 1518  Weight: 144 lb 12.8 oz (65.7 kg)    Physical Exam Constitutional:      General: He is not in acute distress. HENT:     Head: Normocephalic and atraumatic.  Eyes:     General: No scleral icterus. Cardiovascular:     Rate and Rhythm: Normal rate and regular rhythm.     Heart sounds: Normal heart sounds.  Pulmonary:     Effort: Pulmonary effort is normal. No respiratory distress.     Breath sounds: No wheezing.  Abdominal:     General: Bowel sounds are normal. There is no distension.     Palpations: Abdomen is soft.  Musculoskeletal:        General: No deformity. Normal range of motion.     Cervical back: Normal range of motion and neck supple.  Skin:    General: Skin is warm and dry.     Findings: No erythema or rash.  Neurological:     Mental Status: He is alert and oriented to person, place, and time. Mental status is at baseline.     Cranial Nerves: No cranial nerve deficit.     Coordination: Coordination normal.  Psychiatric:        Mood and Affect: Mood normal.    LABORATORY DATA:  I have reviewed the data as listed Lab Results  Component Value Date   WBC 6.3 10/07/2021   HGB 13.5 10/07/2021   HCT 39.9 10/07/2021   MCV 89.9  10/07/2021   PLT 185 10/07/2021   Recent Labs    10/07/21 1612  NA 137  K 4.1  CL 102  CO2 25  GLUCOSE 218*  BUN 25*  CREATININE 1.18  CALCIUM 9.2  GFRNONAA >60  PROT 6.8  ALBUMIN 3.9  AST 31  ALT 27  ALKPHOS 49  BILITOT 0.6   Iron/TIBC/Ferritin/ %Sat No results found for: IRON, TIBC, FERRITIN, IRONPCTSAT  RADIOGRAPHIC STUDIES: I have personally reviewed the radiological images as listed and agreed with the findings in the report. No results found.    ASSESSMENT & PLAN:  1. MGUS (monoclonal gammopathy of unknown significance)   2. Stage 3a chronic kidney disease ([place])    # IgG kappa MGUS I discussed with patient about the diagnosis of IgG MGUS which is an asymptomatic condition which has a small risk of progression to smoldering multiple myeloma and to symptomatic multiple myeloma. Less frequently, these patients progress to AL amyloidosis, light chain deposition disease, or another lymphoproliferative disorder. Check CBC CMP SPEP, light chain ratio, flowcytometry, UPEP. For now I recommend observation. Check SPEP and light chain ratio every 6 months.   #CKD, stage 3 Avoid nephrotoxin.  Encourage hydration.    Orders Placed This Encounter  Procedures   Comprehensive metabolic panel    Standing Status:   Future    Number of Occurrences:   1    Standing Expiration Date:   10/07/2022   CBC with Differential/Platelet    Standing Status:   Future    Number of Occurrences:   1    Standing Expiration Date:   10/07/2022   Technologist smear review    Standing Status:   Future    Number of Occurrences:   1    Standing Expiration Date:   10/07/2022   Multiple Myeloma Panel (SPEP&IFE w/QIG)    Standing Status:   Future    Number of Occurrences:   1    Standing Expiration Date:   10/07/2022   Kappa/lambda light chains    Standing Status:   Future    Number of Occurrences:   1    Standing Expiration Date:   10/07/2022   Flow cytometry panel-leukemia/lymphoma  work-up    Standing Status:   Future    Number of Occurrences:   1    Standing Expiration Date:   10/07/2022   IFE+PROTEIN ELECTRO, 24-HR UR    Standing Status:   Future    Standing Expiration Date:   10/07/2022    All questions were answered. The patient knows to call the clinic with any problems questions or concerns.  cc Vladimir Crofts, MD    Return of visit: follow up in 6 months. Thank you for this kind referral and the opportunity to participate in the care of this patient. A copy of today's note is routed to referring provider   Earlie Server, MD, PhD  10/08/2021

## 2021-10-12 LAB — COMP PANEL: LEUKEMIA/LYMPHOMA

## 2021-10-12 LAB — MULTIPLE MYELOMA PANEL, SERUM
Albumin SerPl Elph-Mcnc: 3.9 g/dL (ref 2.9–4.4)
Albumin/Glob SerPl: 1.6 (ref 0.7–1.7)
Alpha 1: 0.2 g/dL (ref 0.0–0.4)
Alpha2 Glob SerPl Elph-Mcnc: 0.6 g/dL (ref 0.4–1.0)
B-Globulin SerPl Elph-Mcnc: 0.9 g/dL (ref 0.7–1.3)
Gamma Glob SerPl Elph-Mcnc: 0.9 g/dL (ref 0.4–1.8)
Globulin, Total: 2.6 g/dL (ref 2.2–3.9)
IgA: 168 mg/dL (ref 61–437)
IgG (Immunoglobin G), Serum: 896 mg/dL (ref 603–1613)
IgM (Immunoglobulin M), Srm: 98 mg/dL (ref 15–143)
M Protein SerPl Elph-Mcnc: 0.5 g/dL — ABNORMAL HIGH
Total Protein ELP: 6.5 g/dL (ref 6.0–8.5)

## 2021-10-14 ENCOUNTER — Telehealth: Payer: Self-pay

## 2021-10-14 NOTE — Telephone Encounter (Signed)
Per Mychart message from Dr. Tasia Catchings to pt:  "Terry Vazquez, you blood work confirms that you have MGUS, which is a pre condition of plasma cell disorder. No need to treatment. Please keep same follow up appointment.  If convenient, please submit your 24 hour urine sample. Thanks"  Unable to reach pt by phone. Detailed VM left on cell phone.

## 2021-10-14 NOTE — Telephone Encounter (Signed)
-----   Message from Earlie Server, MD sent at 10/13/2021  8:41 AM EST ----- My chart message sent

## 2021-11-04 ENCOUNTER — Inpatient Hospital Stay: Payer: Medicare PPO | Attending: Oncology

## 2021-11-04 DIAGNOSIS — D472 Monoclonal gammopathy: Secondary | ICD-10-CM | POA: Insufficient documentation

## 2021-11-05 ENCOUNTER — Other Ambulatory Visit: Payer: Self-pay

## 2021-11-05 DIAGNOSIS — N1831 Chronic kidney disease, stage 3a: Secondary | ICD-10-CM

## 2021-11-05 DIAGNOSIS — D472 Monoclonal gammopathy: Secondary | ICD-10-CM

## 2021-11-08 LAB — IFE+PROTEIN ELECTRO, 24-HR UR
% BETA, Urine: 0 %
ALPHA 1 URINE: 0 %
Albumin, U: 100 %
Alpha 2, Urine: 0 %
GAMMA GLOBULIN URINE: 0 %
Total Protein, Urine-Ur/day: 128 mg/24 hr (ref 30–150)
Total Protein, Urine: 8.5 mg/dL
Total Volume: 1500

## 2021-12-17 ENCOUNTER — Other Ambulatory Visit: Payer: Self-pay | Admitting: Student

## 2021-12-17 DIAGNOSIS — R202 Paresthesia of skin: Secondary | ICD-10-CM

## 2021-12-29 ENCOUNTER — Ambulatory Visit
Admission: RE | Admit: 2021-12-29 | Discharge: 2021-12-29 | Disposition: A | Discharge status: Home / Self Care | Payer: Medicare PPO | Source: Ambulatory Visit | Attending: Student | Admitting: Student

## 2021-12-29 DIAGNOSIS — R202 Paresthesia of skin: Secondary | ICD-10-CM

## 2021-12-29 IMAGING — MR MR LUMBAR SPINE W/O CM
5 series · 30 of 48 positions shown · non-contrast
Comparison: None.

CLINICAL DATA: Low back pain with left leg weakness for 1 year, and
notes pain is getting worse over time.

EXAM:
MRI LUMBAR SPINE WITHOUT CONTRAST
TECHNIQUE: Multiplanar, multisequence MR imaging of the lumbar spine was
performed. No intravenous contrast was administered.

[Series 5: T2 · sagittal · 4.0mm · 0.81mm/px · 6 of 17 slices shown (1 of 2)]
[im 1/17]
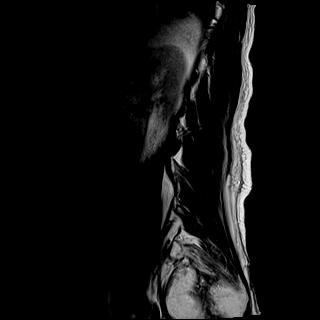
[im 4/17]
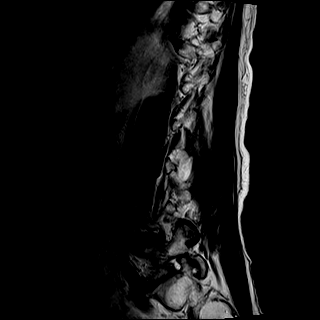
[im 7/17]
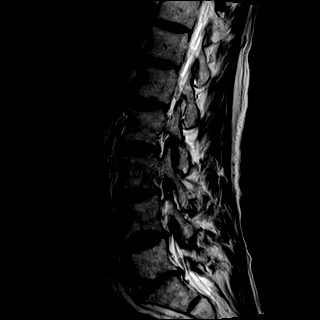
[im 10/17]
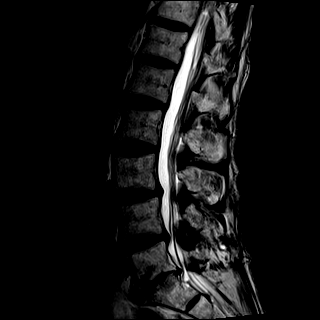
[im 13/17]
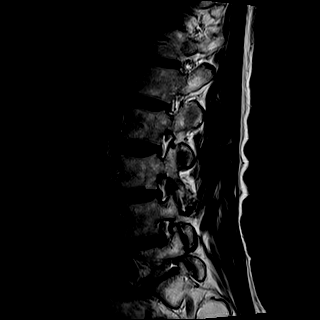
[im 17/17]
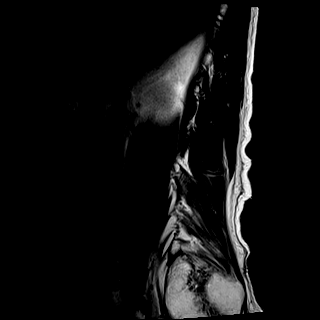

[Series 6: T1 · sagittal · 4.0mm · 0.81mm/px · 7 of 17 slices shown (1 of 2)]
[im 1/17]
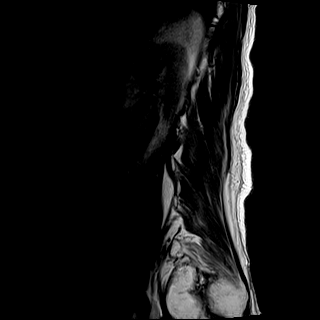
[im 3/17]
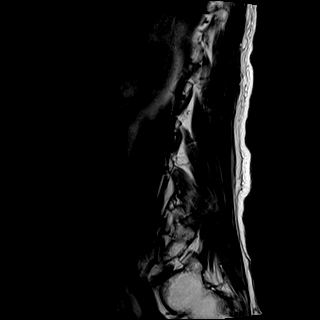
[im 6/17]
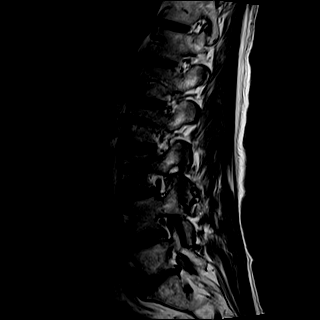
[im 9/17]
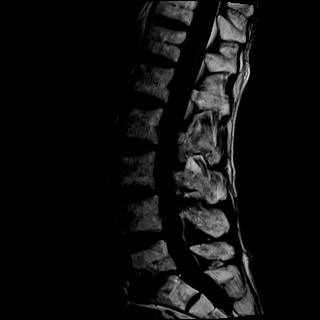
[im 11/17]
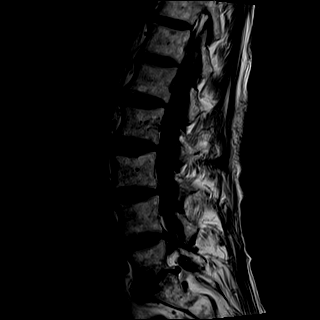
[im 14/17]
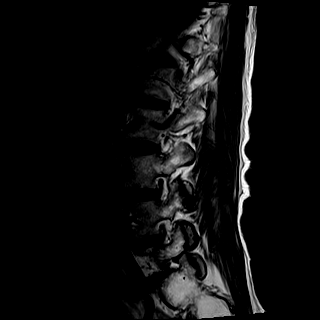
[im 17/17]
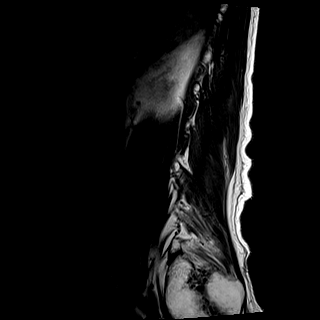

[Series 7: STIR · sagittal · 4.0mm · 0.41mm/px · 1 of 17 slices shown]
[im 1/17]
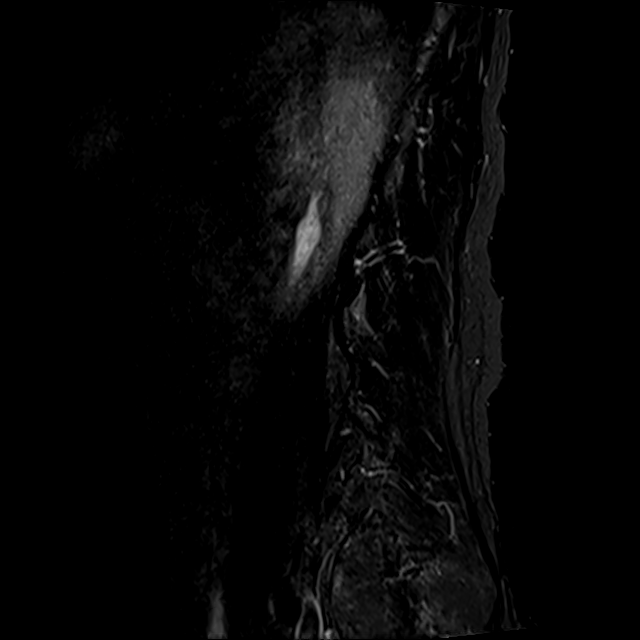

[Series 8: T2 · axial · 4.0mm · 0.78mm/px · z∈[-193,+25]mm · 8 of 37 slices shown (2 of 2)]
[im 1/37]
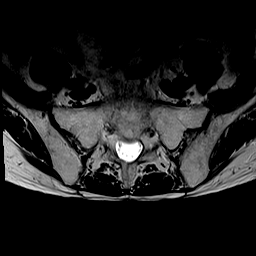
[im 6/37]
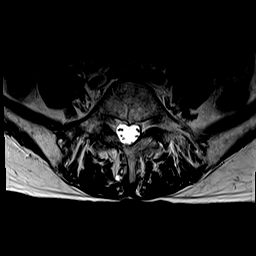
[im 12/37]
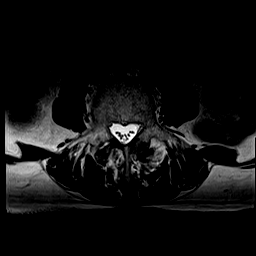
[im 17/37]
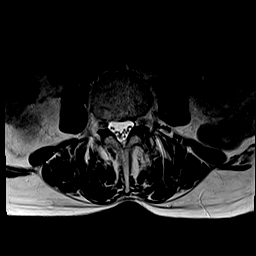
[im 20/37]
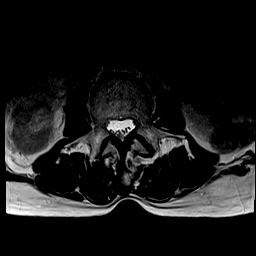
[im 25/37]
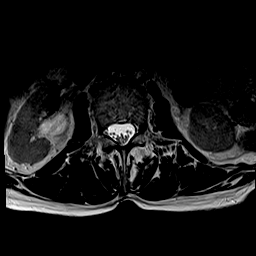
[im 31/37]
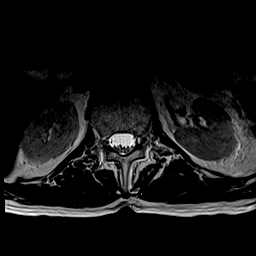
[im 37/37]
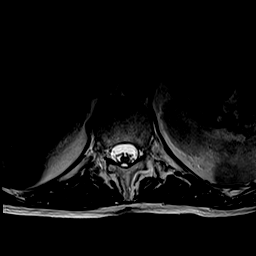

[Series 9: T1 · axial · 4.0mm · 0.39mm/px · z∈[-193,+25]mm · 8 of 37 slices shown (2 of 2)]
[im 1/37]
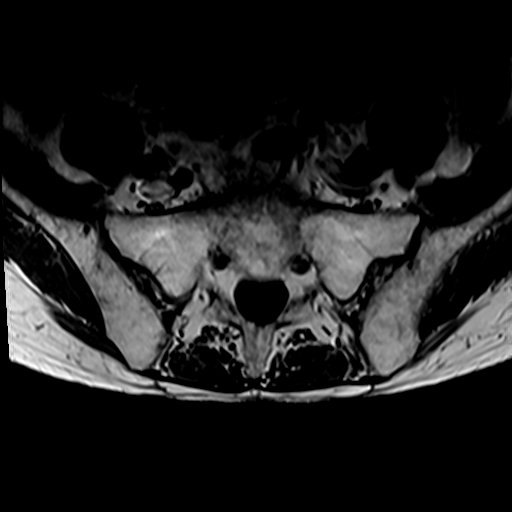
[im 6/37]
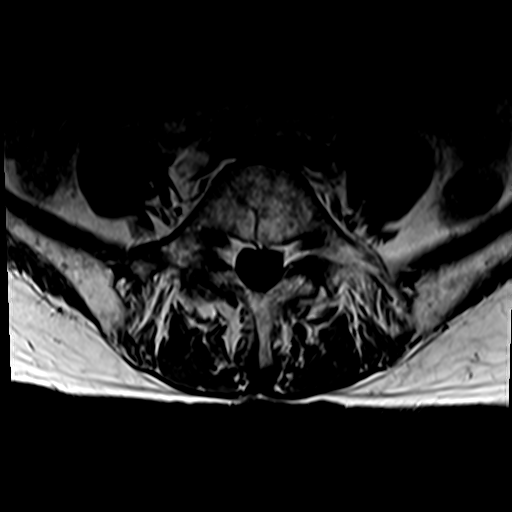
[im 12/37]
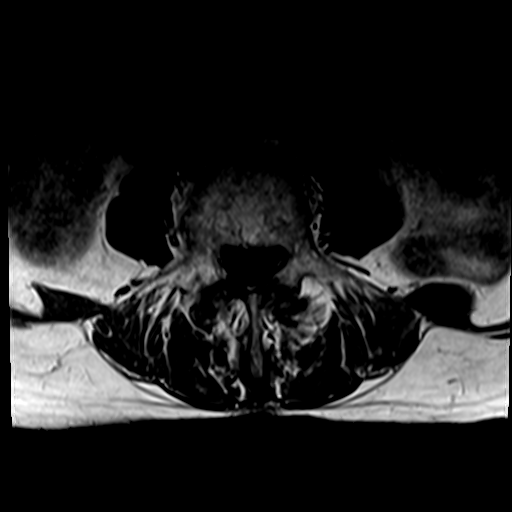
[im 17/37]
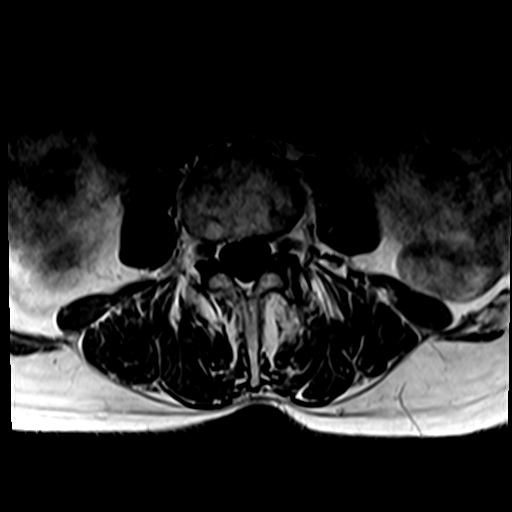
[im 20/37]
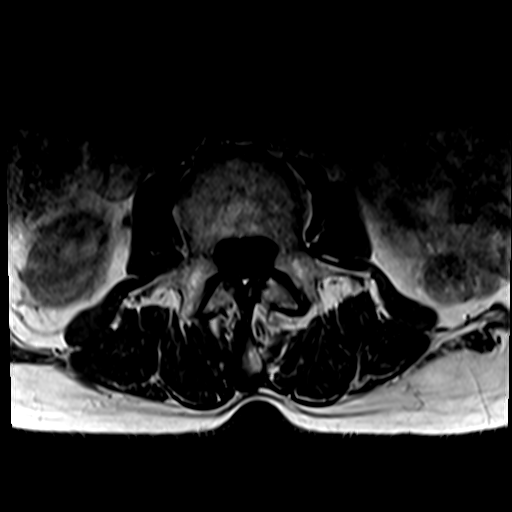
[im 25/37]
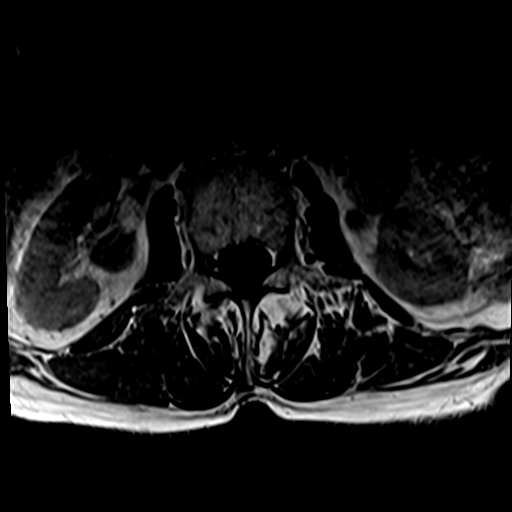
[im 31/37]
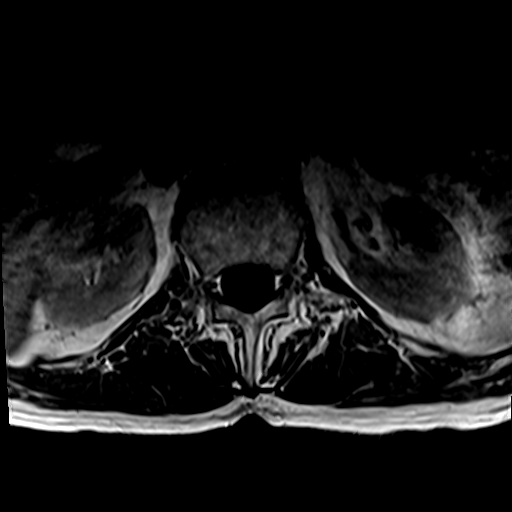
[im 37/37]
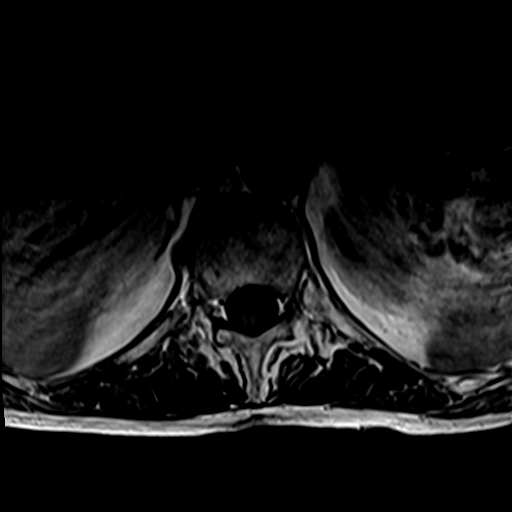

[30 of 48 positions shown; findings below may reference images not displayed]

FINDINGS: Segmentation:  Standard.

Alignment: Trace anterolisthesis of L3 over L4. Trace retrolisthesis
of L5 over S1.

Vertebrae: No fracture, evidence of discitis, or bone lesion.
Endplate degenerative changes with associated marrow edema at L5-S1.

Conus medullaris and cauda equina: Conus extends to the T12-L1
level. Conus and cauda equina appear normal.

Paraspinal and other soft tissues: Negative.

Disc levels:

T12-L1: No spinal canal or neural foraminal stenosis.

L1-2: Shallow disc bulge and mild facet degenerative changes. No
significant spinal canal or neural foraminal stenosis.

L2-3: Disc bulge, mild-to-moderate facet degenerative changes and
ligamentum flavum redundancy resulting in mild spinal canal stenosis
and minimal left neural foraminal narrowing.

L3-4: Disc bulge, moderate hypertrophic facet degenerative changes
with trace bilateral joint effusion and ligamentum flavum redundancy
result mild-to-moderate spinal canal stenosis with moderate
narrowing of the bilateral subarticular zones and minimal bilateral
neural foraminal narrowing.

L4-5: Disc bulge, moderate hypertrophic facet degenerative changes
and ligamentum flavum redundancy resulting in mild-to-moderate
spinal canal stenosis with moderate narrowing of the bilateral
subarticular zones, right greater than left, and mild bilateral
neural foraminal narrowing.

L5-S1: Loss of disc height, disc bulge with associated osteophytic
component and moderate hypertrophic facet degenerative changes.
Findings result in mild spinal canal stenosis with moderate
narrowing of the bilateral subarticular zones and mild bilateral
neural foraminal narrowing.
IMPRESSION: 1. Degenerative changes of the lumbar spine, more pronounced at
L5-S1 where there is prominent degenerative disc disease with
associated mild endplate marrow edema.
2. No high-grade spinal canal stenosis. However, there is moderate
narrowing of the bilateral subarticular zones from L3-4 through
L5-S1.
3. Mild bilateral neural foraminal narrowing at L4-5 and L5-S1.

## 2021-12-29 IMAGING — MR MR HEAD W/O CM
12 series · 48 of 48 positions shown · non-contrast
Comparison: None.

CLINICAL DATA: Paresthesia of both feet [PF] ([PF]-CM).

EXAM:
MRI HEAD WITHOUT CONTRAST
TECHNIQUE: Multiplanar, multiecho pulse sequences of the brain and surrounding
structures were obtained without intravenous contrast.

[Series 5: ax dwi_tracew · axial · 3.0mm · 0.65mm/px · z∈[-90,+63]mm · 2 of 48 slices shown]
[im 1/48]
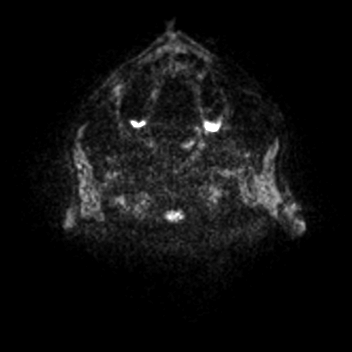
[im 48/48]
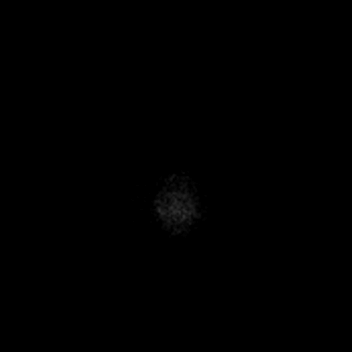

[Series 6: ax dwi_adc · axial · 3.0mm · 0.65mm/px · z∈[-90,+59]mm · 3 of 47 slices shown]
[im 1/47]
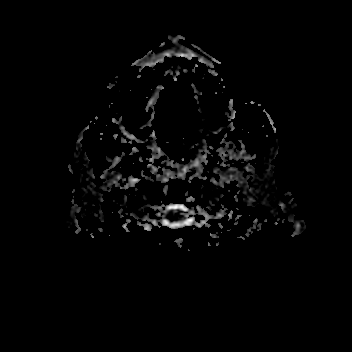
[im 24/47]
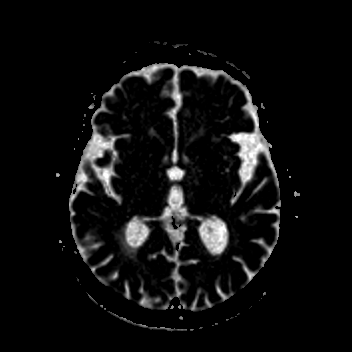
[im 47/47]
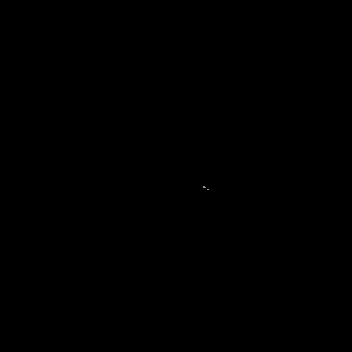

[Series 7: cor dwi_tracew · coronal · 5.0mm · 0.60mm/px · 3 of 38 slices shown]
[im 1/38]
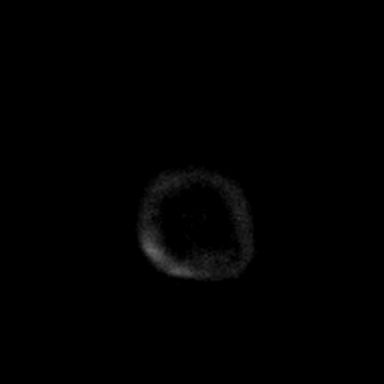
[im 19/38]
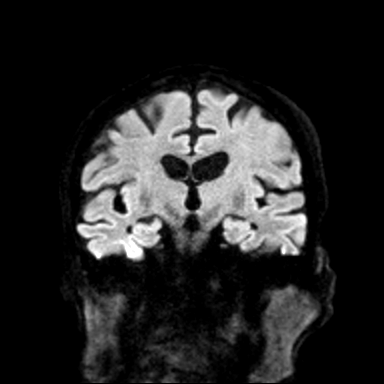
[im 38/38]
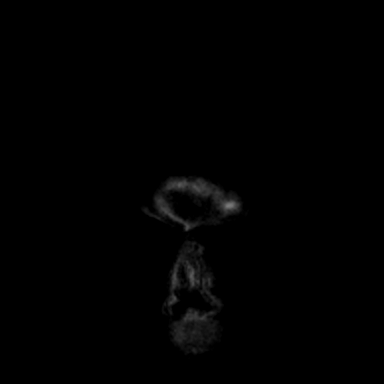

[Series 8: cor dwi_adc · coronal · 5.0mm · 0.60mm/px · 3 of 38 slices shown]
[im 1/38]
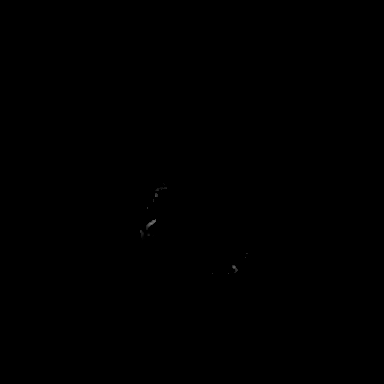
[im 19/38]
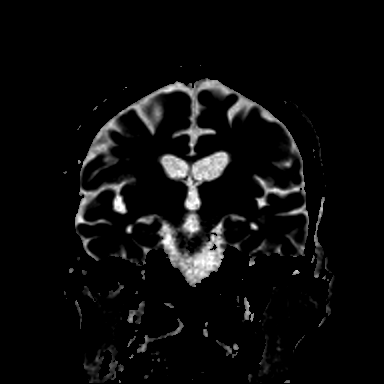
[im 38/38]
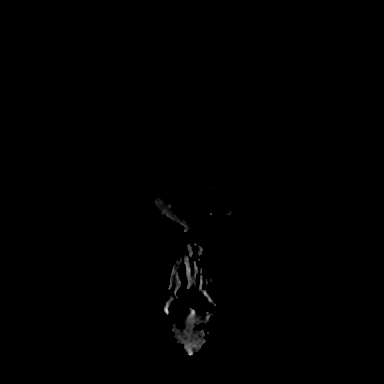

[Series 9: T1 · sagittal · 5.0mm · 0.62mm/px · 2 of 25 slices shown (1 of 2)]
[im 1/25]
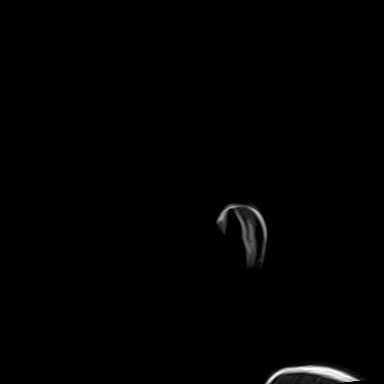
[im 25/25]
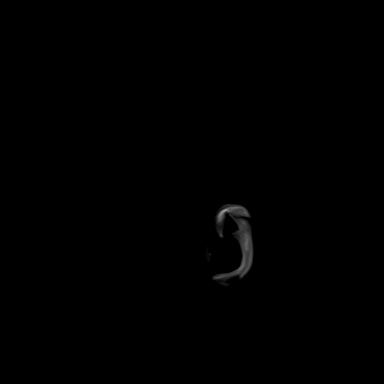

[Series 10: T2 · axial · 5.0mm · 0.53mm/px · z∈[-84,+57]mm · 2 of 25 slices shown (1 of 2)]
[im 1/25]
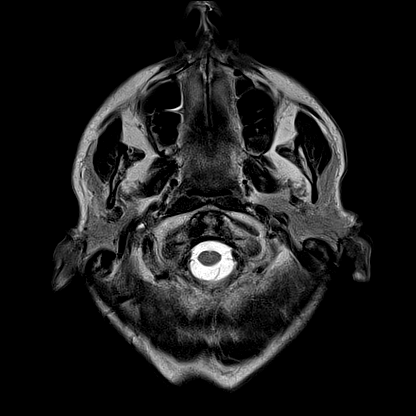
[im 25/25]
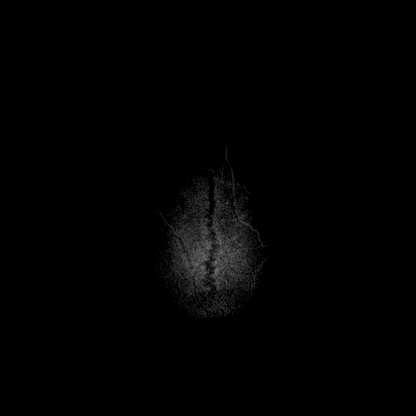

[Series 11: swi_mag · axial · 2.0mm · 0.90mm/px · z∈[-91,+64]mm · 5 of 80 slices shown]
[im 1/80]
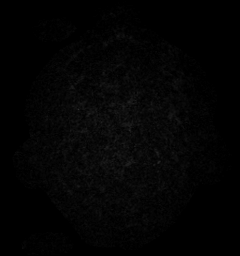
[im 20/80]
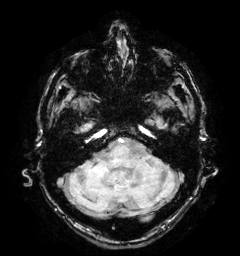
[im 40/80]
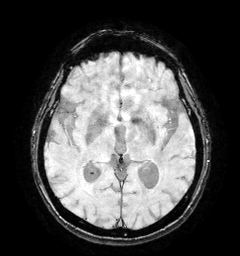
[im 60/80]
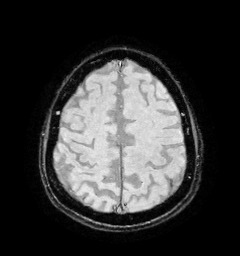
[im 80/80]
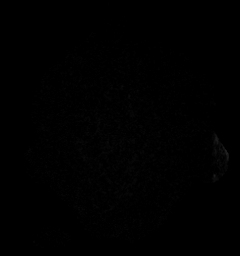

[Series 12: swi_pha · axial · 2.0mm · 0.90mm/px · z∈[-91,+64]mm · 5 of 80 slices shown]
[im 1/80]
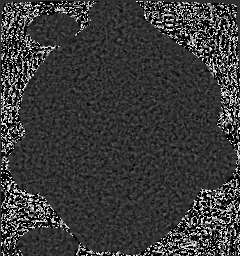
[im 20/80]
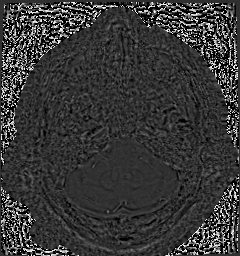
[im 40/80]
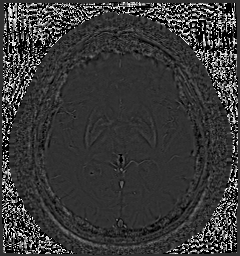
[im 60/80]
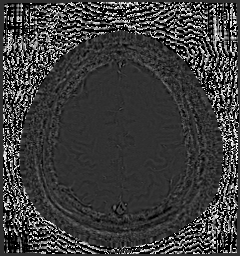
[im 80/80]
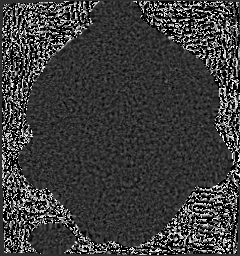

[Series 13: swi_swi · axial · 2.0mm · 0.90mm/px · z∈[-91,+64]mm · 5 of 80 slices shown]
[im 1/80]
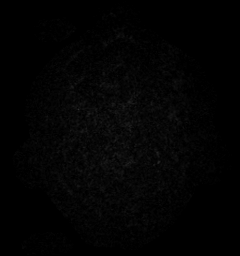
[im 20/80]
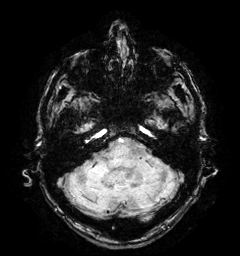
[im 40/80]
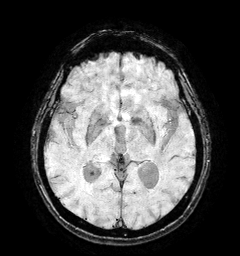
[im 60/80]
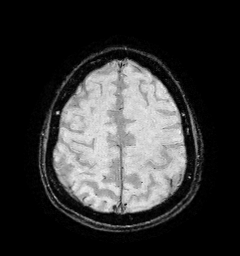
[im 80/80]
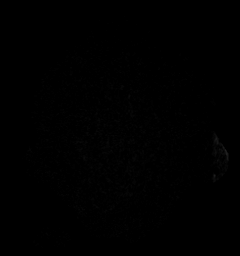

[Series 15: FLAIR · axial · 3.0mm · 0.53mm/px · z∈[-93,+66]mm · 4 of 55 slices shown]
[im 1/55]
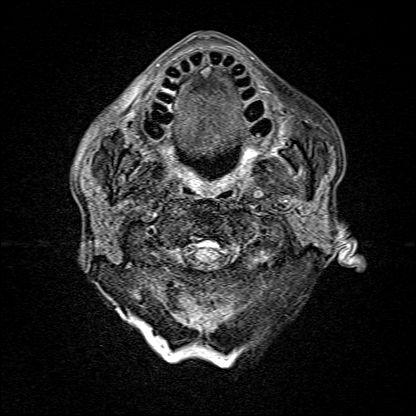
[im 19/55]
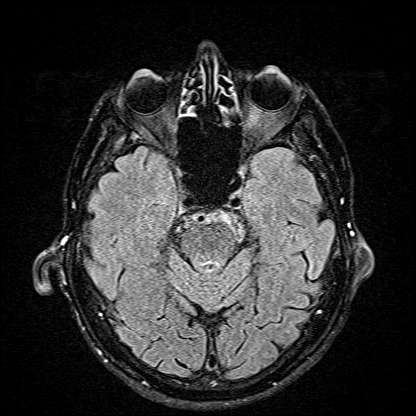
[im 37/55]
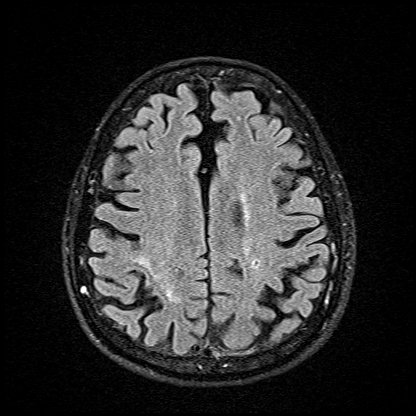
[im 55/55]
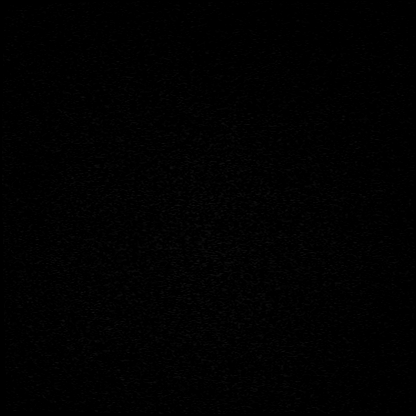

[Series 16: T1 · axial · 1.0mm · 0.98mm/px · z∈[-98,+74]mm · 12 of 176 slices shown (2 of 2)]
[im 1/176]
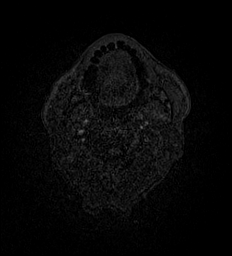
[im 16/176]
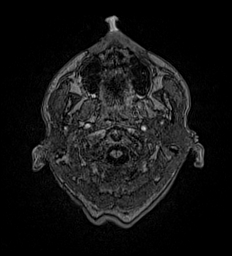
[im 32/176]
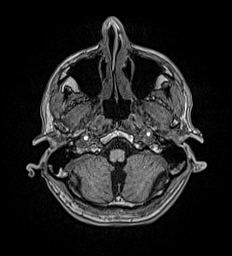
[im 48/176]
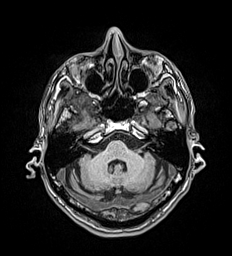
[im 64/176]
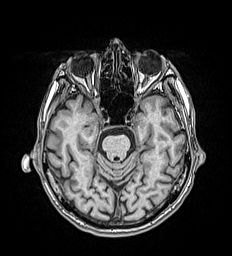
[im 80/176]
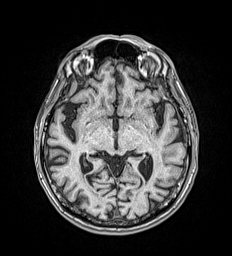
[im 96/176]
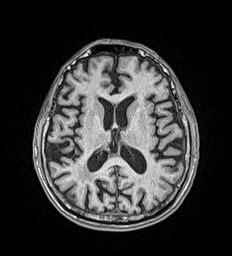
[im 112/176]
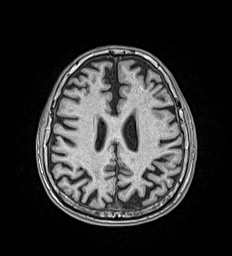
[im 128/176]
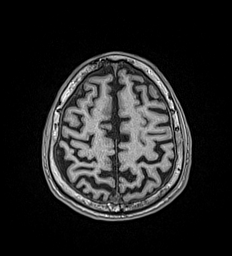
[im 144/176]
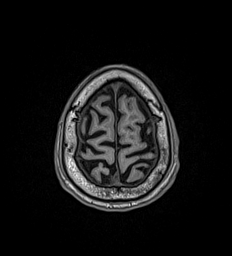
[im 160/176]
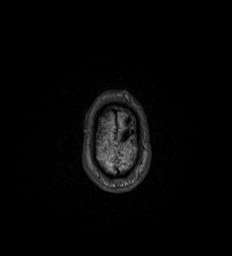
[im 176/176]
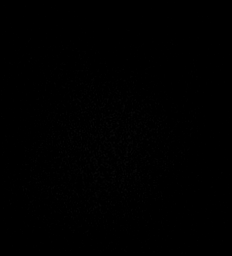

[Series 17: T2 · coronal · 5.0mm · 0.57mm/px · 2 of 29 slices shown (2 of 2)]
[im 1/29]
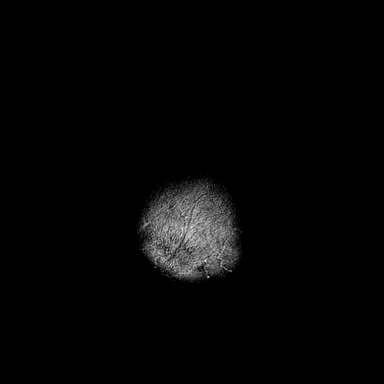
[im 29/29]
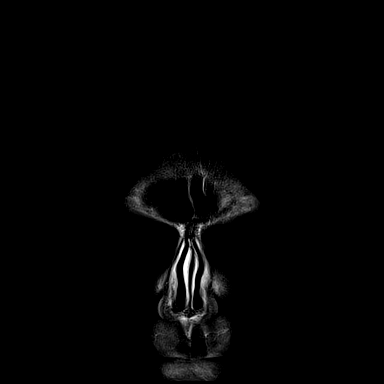

[48 of 48 positions shown; findings below may reference images not displayed]

FINDINGS: Brain: No acute infarction, hemorrhage, hydrocephalus, extra-axial
collection or mass lesion. Scattered and confluent foci of T2
hyperintensity are seen within the white matter of the cerebral
hemispheres, nonspecific most likely related to chronic small vessel
ischemia. Remote lacunar infarcts in the left centrum semiovale.
Mild to moderate parenchymal volume loss.

Vascular: Normal flow voids.

Skull and upper cervical spine: Normal marrow signal.

Sinuses/Orbits: Bilateral lens surgery. Paranasal sinuses are clear.

Other: None.
IMPRESSION: 1. No acute intracranial abnormality.
2. Mild-to-moderate chronic microvascular ischemic changes of the
white matter and parenchymal volume loss.
3. Remote lacunar infarct in the left centrum semiovale.

## 2022-01-03 ENCOUNTER — Telehealth: Payer: Self-pay | Admitting: Physical Therapy

## 2022-01-03 NOTE — Telephone Encounter (Addendum)
Called pt responding to VM left over weekend in which he inquired about whether PT for balance would effect procedure for trigger point release. Could not reach so left VM instructing pt that it should not effect treatment for his balance and to call back for further detail.  ?

## 2022-01-06 ENCOUNTER — Encounter: Payer: Self-pay | Admitting: Physical Therapy

## 2022-01-06 ENCOUNTER — Ambulatory Visit: Payer: Medicare PPO | Attending: Neurology | Admitting: Physical Therapy

## 2022-01-06 DIAGNOSIS — M5416 Radiculopathy, lumbar region: Secondary | ICD-10-CM | POA: Insufficient documentation

## 2022-01-06 NOTE — Therapy (Signed)
?OUTPATIENT PHYSICAL THERAPY THORACOLUMBAR EVALUATION ? ? ?Patient Name: Terry Vazquez ?MRN: [id number] ?DOB:1940/12/31, 81 y.o., male ?Today's Date: 01/06/2022 ? ? PT End of Session - 01/06/22 2025   ? ? Visit Number 1   ? Number of Visits 1   ? Date for PT Re-Evaluation --   N/a eval only  ? PT Start Time 1015   ? PT Stop Time 1100   ? PT Time Calculation (min) 45 min   ? Activity Tolerance Patient tolerated treatment well   ? Behavior During Therapy [facility] for tasks assessed/performed   ? ?  ?  ? ?  ? ? ?Past Medical History:  ?Diagnosis Date  ? Cataract extraction status of right eye   ? CKD (chronic kidney disease)   ? Diabetes mellitus without complication ([place])   ? Hyperlipidemia   ? Hypertension   ? Hypertension associated with diabetes ([place])   ? Migraine without status migrainosus, not intractable   ? Other seasonal allergic rhinitis   ? Puckering of macula   ? Vitreous degeneration of left eye   ? ?Past Surgical History:  ?Procedure Laterality Date  ? CATARACT EXTRACTION, BILATERAL    ? Left Ring and Long Trigger Finger Release    ? TONSILLECTOMY    ? TONSILLECTOMY AND ADENOIDECTOMY    ? TOOTH EXTRACTION    ? ?Patient Active Problem List  ? Diagnosis Date Noted  ? Abnormal SPEP 10/07/2021  ? Chest pain 08/24/2016  ? Essential hypertension 08/24/2016  ? Hyperlipidemia 08/24/2016  ? Jaw pain 08/24/2016  ? Uncontrolled type 2 diabetes mellitus without complication, without long-term current use of insulin 08/24/2016  ? Chronic renal impairment, stage 2 (mild) 08/24/2016  ? ? ?PCP: Sofie Hartigan, MD ? ?REFERRING PROVIDER: Vladimir Crofts, MD ? ?REFERRING DIAG: Left-sided lumbar radiculopathy  ? ?THERAPY DIAG:  ?Radiculopathy, lumbar region ? ?ONSET DATE: 10/10/1960 ? ?SUBJECTIVE:                                                                                                                                                                                          ? ?SUBJECTIVE STATEMENT: ?Pt reports that he went to  PCP who did knee extension strength testing on left leg and he noticed a weakness in left leg. He is very active swimming three days per week and he attends [employer]. He does feel some low back pain after walking for a long distance, but this has been going since he was young, and he is not concerned.  ?PERTINENT HISTORY:  ?12/16/21 Dr. Jennings Books ? ? ?Dear Mr. Manual Navarra, It was our pleasure to participate in  your care in person. We have typed up brief summary of what we discussed. ? ?1. Subjective left leg weakness or easier tiredness in a patient with back pain radiating into left leg, likely lumbar radiculopathy in a patient with Diabetic Polyneuropathy - ?No cortical weakess or suggest central lesion. No muscle atrophy/fasiculations. No dystonic movements or bradykinesia ? ?- Discussed EMG/ ?NCS lower extremity 12/09/2021 - generalized sensorimotor polyneuropathy.  ? ?- reviewed 12/16/2021 - Vit B12, Vit B1, Vit B6, Vit D, ESR, SIEP, folate ? ?MRI Lumbar Spine (L-Spine) without contrast - will prioritize MRI Lumbar Spine (L-Spine) if insurance only covers one  ? ?MRI Brain without contrast  ? ?Physical Therapy - imbalance, left leg weakness  ? ?2. Diabetic Polyneuropathy: ?Diabetes mellitus - well controlled  ? ?Neuropathy is a common medical condition with diverse etiology. Neuropathy can be associated with positive sensory symptoms such as burning, tingling, electric shock sensation, itching, pins and needles etc; negative sensory symptoms such as lack of sensation, inability to differentiate temperature, loss of joint position sensation. Patient was informed about motor and potential autonomic symptoms of some type of neuropathies. Nerve conduction study and EMG can be very useful in classifying neuropathy such as axonal vs. demyelinating, acquired vs. hereditary, generalized vs. focal, sensory vs. motor vs. both etc. Work up to look for potential etiologies will be done if not already done so. Imbalance can be  a symptom of sensory neuropathy related sensory ataxia. Patient should be educated on fall prevention and home safety.  ? ?PAIN:  ?Are you having pain? No ? ? ?PRECAUTIONS: None ? ?WEIGHT BEARING RESTRICTIONS No ? ?FALLS:  ?Has patient fallen in last 6 months? No ? ?LIVING ENVIRONMENT: ?Lives with: lives with their spouse and lives with their daughter ?Lives in: House/apartment ?Stairs: Yes: External: 3 steps; on right going up ?Has following equipment at home: None ? ?OCCUPATION: Retired  ? ?PLOF: Independent ? ?PATIENT GOALS  Pt wants to know whether he actually needs PT.  ? ? ?OBJECTIVE:  ?           VITALS: BP 153/68 HR 66 SpO2 100 ? ?DIAGNOSTIC FINDINGS:  ?CLINICAL DATA:  Low back pain with left leg weakness for 1 year, and ?notes pain is getting worse over time. ?  ?EXAM: ?MRI LUMBAR SPINE WITHOUT CONTRAST ?  ?TECHNIQUE: ?Multiplanar, multisequence MR imaging of the lumbar spine was ?performed. No intravenous contrast was administered. ?  ?COMPARISON:  None. ?  ?FINDINGS: ?Segmentation:  Standard. ?  ?Alignment: Trace anterolisthesis of L3 over L4. Trace retrolisthesis ?of L5 over S1. ?  ?Vertebrae: No fracture, evidence of discitis, or bone lesion. ?Endplate degenerative changes with associated marrow edema at L5-S1. ?  ?Conus medullaris and cauda equina: Conus extends to the T12-L1 ?level. Conus and cauda equina appear normal. ?  ?Paraspinal and other soft tissues: Negative. ?  ?Disc levels: ?  ?T12-L1: No spinal canal or neural foraminal stenosis. ?  ?L1-2: Shallow disc bulge and mild facet degenerative changes. No ?significant spinal canal or neural foraminal stenosis. ?  ?L2-3: Disc bulge, mild-to-moderate facet degenerative changes and ?ligamentum flavum redundancy resulting in mild spinal canal stenosis ?and minimal left neural foraminal narrowing. ?  ?L3-4: Disc bulge, moderate hypertrophic facet degenerative changes ?with trace bilateral joint effusion and ligamentum flavum redundancy ?result  mild-to-moderate spinal canal stenosis with moderate ?narrowing of the bilateral subarticular zones and minimal bilateral ?neural foraminal narrowing. ?  ?L4-5: Disc bulge, moderate hypertrophic facet degenerative changes ?and ligamentum flavum redundancy resulting in  mild-to-moderate ?spinal canal stenosis with moderate narrowing of the bilateral ?subarticular zones, right greater than left, and mild bilateral ?neural foraminal narrowing. ?  ?L5-S1: Loss of disc height, disc bulge with associated osteophytic ?component and moderate hypertrophic facet degenerative changes. ?Findings result in mild spinal canal stenosis with moderate ?narrowing of the bilateral subarticular zones and mild bilateral ?neural foraminal narrowing. ?  ?IMPRESSION: ?1. Degenerative changes of the lumbar spine, more pronounced at ?L5-S1 where there is prominent degenerative disc disease with ?associated mild endplate marrow edema. ?2. No high-grade spinal canal stenosis. However, there is moderate ?narrowing of the bilateral subarticular zones from L3-4 through ?L5-S1. ?3. Mild bilateral neural foraminal narrowing at L4-5 and L5-S1. ?  ?  ?Electronically Signed ?  By: Pedro Earls M.D. ?  On: 12/31/2021 10:11 ? ?PATIENT SURVEYS:  ?FOTO N/a - eval only  ? ?SCREENING FOR RED FLAGS: ?Bowel or bladder incontinence: No ?Spinal tumors: No ?Cauda equina syndrome: No ?Compression fracture: No ?Abdominal aneurysm: No ? ?COGNITION: ? Overall cognitive status: Within functional limits for tasks assessed   ?  ?SENSATION: ?Diabetic neuropathy on entire right foot that extends up to ankle ? ? ?MUSCLE LENGTH: ?Hamstrings: Right 60 deg; Left 60 deg ?Thomas test: Right 0 deg; Left 0 deg ? ?POSTURE:  ?No abnormalities noted  ? ?PALPATION: ?Pain on lateral malleolus on left foot  ? ?LUMBAR ROM:  ? ?Active  A/PROM  ?01/06/2022  ?Flexion 100%  ?Extension 100%  ?Right lateral flexion 100%  ?Left lateral flexion 100%  ?Right rotation 100%  ?Left  rotation   ? (Blank rows = not tested) ? ?LE ROM: ? ?Active  Right ?01/06/2022 Left ?01/06/2022  ?Hip flexion 135 135  ?Hip extension 0 0  ?Hip abduction 35 35  ?Hip adduction 25 25  ?Hip internal rotation    ?H

## 2022-01-11 ENCOUNTER — Encounter: Payer: Medicare PPO | Admitting: Physical Therapy

## 2022-01-13 ENCOUNTER — Encounter: Payer: Medicare PPO | Admitting: Physical Therapy

## 2022-01-18 ENCOUNTER — Encounter: Payer: Medicare PPO | Admitting: Physical Therapy

## 2022-01-20 ENCOUNTER — Encounter: Payer: Medicare PPO | Admitting: Physical Therapy

## 2022-01-25 ENCOUNTER — Encounter: Payer: Medicare PPO | Admitting: Physical Therapy

## 2022-01-27 ENCOUNTER — Encounter: Payer: Medicare PPO | Admitting: Physical Therapy

## 2022-02-01 ENCOUNTER — Encounter: Payer: Medicare PPO | Admitting: Physical Therapy

## 2022-02-03 ENCOUNTER — Encounter: Payer: Medicare PPO | Admitting: Physical Therapy

## 2022-02-08 ENCOUNTER — Encounter: Payer: Medicare PPO | Admitting: Physical Therapy

## 2022-03-30 ENCOUNTER — Other Ambulatory Visit: Payer: Self-pay

## 2022-03-30 DIAGNOSIS — R778 Other specified abnormalities of plasma proteins: Secondary | ICD-10-CM

## 2022-03-31 ENCOUNTER — Inpatient Hospital Stay: Payer: Medicare PPO | Attending: Oncology

## 2022-03-31 DIAGNOSIS — R778 Other specified abnormalities of plasma proteins: Secondary | ICD-10-CM

## 2022-03-31 DIAGNOSIS — D472 Monoclonal gammopathy: Secondary | ICD-10-CM | POA: Insufficient documentation

## 2022-03-31 LAB — CBC WITH DIFFERENTIAL/PLATELET
Abs Immature Granulocytes: 0.01 10*3/uL (ref 0.00–0.07)
Basophils Absolute: 0 10*3/uL (ref 0.0–0.1)
Basophils Relative: 1 %
Eosinophils Absolute: 0.1 10*3/uL (ref 0.0–0.5)
Eosinophils Relative: 2 %
HCT: 38.6 % — ABNORMAL LOW (ref 39.0–52.0)
Hemoglobin: 13 g/dL (ref 13.0–17.0)
Immature Granulocytes: 0 %
Lymphocytes Relative: 30 %
Lymphs Abs: 1.7 10*3/uL (ref 0.7–4.0)
MCH: 30.5 pg (ref 26.0–34.0)
MCHC: 33.7 g/dL (ref 30.0–36.0)
MCV: 90.6 fL (ref 80.0–100.0)
Monocytes Absolute: 0.4 10*3/uL (ref 0.1–1.0)
Monocytes Relative: 7 %
Neutro Abs: 3.2 10*3/uL (ref 1.7–7.7)
Neutrophils Relative %: 60 %
Platelets: 181 10*3/uL (ref 150–400)
RBC: 4.26 MIL/uL (ref 4.22–5.81)
RDW: 12.8 % (ref 11.5–15.5)
WBC: 5.4 10*3/uL (ref 4.0–10.5)
nRBC: 0 % (ref 0.0–0.2)

## 2022-03-31 LAB — COMPREHENSIVE METABOLIC PANEL
ALT: 32 U/L (ref 0–44)
AST: 36 U/L (ref 15–41)
Albumin: 3.8 g/dL (ref 3.5–5.0)
Alkaline Phosphatase: 68 U/L (ref 38–126)
Anion gap: 7 (ref 5–15)
BUN: 19 mg/dL (ref 8–23)
CO2: 23 mmol/L (ref 22–32)
Calcium: 8.9 mg/dL (ref 8.9–10.3)
Chloride: 104 mmol/L (ref 98–111)
Creatinine, Ser: 1.17 mg/dL (ref 0.61–1.24)
GFR, Estimated: >60 mL/min (ref >60)
Glucose, Bld: 203 mg/dL — ABNORMAL HIGH (ref 70–99)
Potassium: 3.9 mmol/L (ref 3.5–5.1)
Sodium: 134 mmol/L — ABNORMAL LOW (ref 135–145)
Total Bilirubin: 0.6 mg/dL (ref 0.3–1.2)
Total Protein: 6.7 g/dL (ref 6.5–8.1)

## 2022-04-01 LAB — KAPPA/LAMBDA LIGHT CHAINS
Kappa free light chain: 226.4 mg/L — ABNORMAL HIGH (ref 3.3–19.4)
Kappa, lambda light chain ratio: 6.24 — ABNORMAL HIGH (ref 0.26–1.65)
Lambda free light chains: 36.3 mg/L — ABNORMAL HIGH (ref 5.7–26.3)

## 2022-04-05 LAB — MULTIPLE MYELOMA PANEL, SERUM
Albumin SerPl Elph-Mcnc: 3.7 g/dL (ref 2.9–4.4)
Albumin/Glob SerPl: 1.5 (ref 0.7–1.7)
Alpha 1: 0.2 g/dL (ref 0.0–0.4)
Alpha2 Glob SerPl Elph-Mcnc: 0.6 g/dL (ref 0.4–1.0)
B-Globulin SerPl Elph-Mcnc: 0.8 g/dL (ref 0.7–1.3)
Gamma Glob SerPl Elph-Mcnc: 0.9 g/dL (ref 0.4–1.8)
Globulin, Total: 2.5 g/dL (ref 2.2–3.9)
IgA: 153 mg/dL (ref 61–437)
IgG (Immunoglobin G), Serum: 923 mg/dL (ref 603–1613)
IgM (Immunoglobulin M), Srm: 81 mg/dL (ref 15–143)
M Protein SerPl Elph-Mcnc: 0.4 g/dL — ABNORMAL HIGH
Total Protein ELP: 6.2 g/dL (ref 6.0–8.5)

## 2022-04-07 ENCOUNTER — Inpatient Hospital Stay: Payer: Medicare PPO | Admitting: Oncology

## 2022-04-07 ENCOUNTER — Encounter: Payer: Self-pay | Admitting: Oncology

## 2022-04-07 VITALS — BP 137/76 | HR 60 | Temp 97.9°F | Resp 18 | Wt 139.4 lb

## 2022-04-07 DIAGNOSIS — R778 Other specified abnormalities of plasma proteins: Secondary | ICD-10-CM

## 2022-04-07 DIAGNOSIS — D472 Monoclonal gammopathy: Secondary | ICD-10-CM | POA: Diagnosis not present

## 2022-04-08 NOTE — Progress Notes (Signed)
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 Stable M protein and slightly increased light chain ratio. Check 24 hour UPEP Continue observation.  Orders Placed This Encounter  Procedures   Comprehensive metabolic panel    Standing Status:   Future    Standing Expiration Date:   04/07/2023   CBC with Differential/Platelet    Standing Status:   Future    Standing Expiration Date:   04/08/2023   Multiple Myeloma Panel (SPEP&IFE w/QIG)    Standing Status:   Future    Standing Expiration Date:   04/07/2023   Kappa/lambda light chains    Standing Status:   Future    Standing Expiration Date:   04/07/2023   IFE+PROTEIN ELECTRO, 24-HR UR    Standing Status:   Future    Standing Expiration Date:   04/08/2023   IFE+PROTEIN ELECTRO, 24-HR UR    Standing Status:   Future    Standing Expiration Date:   04/08/2023    All questions were answered. The patient knows to call the clinic with any problems, questions or concerns.  Earlie Server, MD, PhD [facility] Hematology Oncology 04/07/2022    CHIEF COMPLAINTS/REASON FOR VISIT:  Follow up for MGUS  HISTORY OF PRESENTING ILLNESS:   Terry Vazquez is a  81 y.o.  male presents for follow up of MGUS.   Patient recently was seen by neurology for left leg weakness, back pain, diabetic polyneuropathy.  He had work up done.  08/20/2021 SPEP showed M spike of 0.5g/dl, IgG kappa.  09/14/2021 Free kappa light chain 132.9, lamda light chain 36.9, kappa/lamda ratio 3.6 He was referred to hematology for further evaluation.    INTERVAL HISTORY Patient feels well today. No new  complaints.    Review of Systems  Constitutional:  Negative for appetite change, chills, fatigue, fever and unexpected weight change.  HENT:   Negative for hearing loss and voice change.   Eyes:  Negative for eye problems and icterus.  Respiratory:  Negative for chest tightness, cough and shortness of breath.   Cardiovascular:  Negative for chest pain and leg swelling.  Gastrointestinal:  Negative for abdominal distention and abdominal pain.  Endocrine: Negative for hot flashes.  Genitourinary:  Negative for difficulty urinating, dysuria and frequency.   Musculoskeletal:  Negative for arthralgias.  Skin:  Negative for itching and rash.  Neurological:  Positive for extremity weakness. Negative for light-headedness and numbness.  Hematological:  Negative for adenopathy. Does not bruise/bleed easily.  Psychiatric/Behavioral:  Negative for confusion.     MEDICAL HISTORY:  Past Medical History:  Diagnosis Date   Cataract extraction status of right eye    CKD (chronic kidney disease)    Diabetes mellitus without complication ([place])    Hyperlipidemia    Hypertension    Hypertension associated with diabetes ([place])    Migraine without status migrainosus, not intractable    Other seasonal allergic rhinitis    Puckering of macula    Vitreous degeneration of left eye     SURGICAL HISTORY: Past Surgical History:  Procedure Laterality Date   CATARACT EXTRACTION, BILATERAL     Left Ring and Long Trigger Finger Release  TONSILLECTOMY     TONSILLECTOMY AND ADENOIDECTOMY     TOOTH EXTRACTION      SOCIAL HISTORY: Social History   Socioeconomic History   Marital status: Married    Spouse name: Not on file   Number of children: Not on file   Years of education: Not on file   Highest education level: Not on file  Occupational History   Not on file  Tobacco Use   Smoking status: Never   Smokeless tobacco: Never  Vaping Use   Vaping Use: Never used  Substance and Sexual Activity    Alcohol use: Never   Drug use: Never   Sexual activity: Not on file  Other Topics Concern   Not on file  Social History Narrative   Not on file   Social Determinants of Health   Financial Resource Strain: Not on file  Food Insecurity: Not on file  Transportation Needs: Not on file  Physical Activity: Not on file  Stress: Not on file  Social Connections: Not on file  Intimate Partner Violence: Not on file    FAMILY HISTORY: Family History  Problem Relation Age of Onset   Diabetes Mother    Parkinson's disease Mother    Heart failure Father    Kidney disease Father    Rheum arthritis Sister    Diabetes Brother    Heart failure Brother    Arthritis Brother    Diabetes Other        multiple cousins with diabetes    ALLERGIES:  has No Known Allergies.  MEDICATIONS:  Current Outpatient Medications  Medication Sig Dispense Refill   aspirin EC 81 MG tablet Take 81 mg by mouth daily.      atorvastatin (LIPITOR) 20 MG tablet Take 20 mg by mouth daily.      benazepril (LOTENSIN) 20 MG tablet Take 20 mg by mouth daily.      Dulaglutide 0.75 MG/0.5ML SOPN Inject 0.75 mg into the skin once a week.      glucosamine-chondroitin 500-400 MG tablet Take 1 tablet by mouth daily.      Insulin Degludec (TRESIBA FLEXTOUCH [place]) Inject 13 Units into the skin at bedtime.     Loratadine 10 MG CAPS Take 10 mg by mouth daily.      omeprazole (PRILOSEC) 20 MG capsule Take by mouth.     OVER THE COUNTER MEDICATION NATURAL BLACK CHERRY CONCENTRATE     metFORMIN (GLUCOPHAGE-XR) 500 MG 24 hr tablet Take 500 mg by mouth 2 (two) times daily.  (Patient not taking: Reported on 04/07/2022)     No current facility-administered medications for this visit.     PHYSICAL EXAMINATION: ECOG PERFORMANCE STATUS: 1 - Symptomatic but completely ambulatory Vitals:   04/07/22 1404  BP: 137/76  Pulse: 60  Resp: 18  Temp: 97.9 F (36.6 C)   Filed Weights   04/07/22 1404  Weight: 139 lb 6.4 oz (63.2 kg)     Physical Exam Constitutional:      General: He is not in acute distress. HENT:     Head: Normocephalic and atraumatic.  Eyes:     General: No scleral icterus. Cardiovascular:     Rate and Rhythm: Normal rate and regular rhythm.     Heart sounds: Normal heart sounds.  Pulmonary:     Effort: Pulmonary effort is normal. No respiratory distress.     Breath sounds: No wheezing.  Abdominal:     General: Bowel sounds are normal. There is no distension.  Palpations: Abdomen is soft.  Musculoskeletal:        General: No deformity. Normal range of motion.     Cervical back: Normal range of motion and neck supple.  Skin:    General: Skin is warm and dry.     Findings: No erythema or rash.  Neurological:     Mental Status: He is alert and oriented to person, place, and time. Mental status is at baseline.     Cranial Nerves: No cranial nerve deficit.     Coordination: Coordination normal.  Psychiatric:        Mood and Affect: Mood normal.     LABORATORY DATA:  I have reviewed the data as listed Lab Results  Component Value Date   WBC 5.4 03/31/2022   HGB 13.0 03/31/2022   HCT 38.6 (L) 03/31/2022   MCV 90.6 03/31/2022   PLT 181 03/31/2022   Recent Labs    10/07/21 1612 03/31/22 1256  NA 137 134*  K 4.1 3.9  CL 102 104  CO2 25 23  GLUCOSE 218* 203*  BUN 25* 19  CREATININE 1.18 1.17  CALCIUM 9.2 8.9  GFRNONAA >60 >60  PROT 6.8 6.7  ALBUMIN 3.9 3.8  AST 31 36  ALT 27 32  ALKPHOS 49 68  BILITOT 0.6 0.6    Iron/TIBC/Ferritin/ %Sat No results found for: "IRON", "TIBC", "FERRITIN", "IRONPCTSAT"    RADIOGRAPHIC STUDIES: I have personally reviewed the radiological images as listed and agreed with the findings in the report. No results found.

## 2022-04-08 NOTE — Assessment & Plan Note (Addendum)
IgG kappa MGUS Labs are reviewed and discussed with patient. Stable M protein and slightly increased light chain ratio. Check 24 hour UPEP Continue observation.

## 2022-04-22 ENCOUNTER — Other Ambulatory Visit: Payer: Self-pay | Admitting: Lab

## 2022-04-22 DIAGNOSIS — D472 Monoclonal gammopathy: Secondary | ICD-10-CM

## 2022-04-25 LAB — IFE+PROTEIN ELECTRO, 24-HR UR
% BETA, Urine: 66.3 %
ALPHA 1 URINE: 5.3 %
Albumin, U: 10.1 %
Alpha 2, Urine: 9.9 %
GAMMA GLOBULIN URINE: 8.4 %
M-SPIKE %, Urine: 51.2 % — ABNORMAL HIGH
M-Spike, Mg/24 Hr: 76 mg/24 hr — ABNORMAL HIGH
Total Protein, Urine-Ur/day: 148 mg/24 hr (ref 30–150)
Total Protein, Urine: 7.4 mg/dL
Total Volume: 2000

## 2022-07-13 DIAGNOSIS — E7800 Pure hypercholesterolemia, unspecified: Secondary | ICD-10-CM | POA: Diagnosis not present

## 2022-07-13 DIAGNOSIS — E119 Type 2 diabetes mellitus without complications: Secondary | ICD-10-CM | POA: Diagnosis not present

## 2022-07-13 DIAGNOSIS — Z794 Long term (current) use of insulin: Secondary | ICD-10-CM | POA: Diagnosis not present

## 2022-07-13 DIAGNOSIS — I152 Hypertension secondary to endocrine disorders: Secondary | ICD-10-CM | POA: Diagnosis not present

## 2022-07-13 DIAGNOSIS — E1159 Type 2 diabetes mellitus with other circulatory complications: Secondary | ICD-10-CM | POA: Diagnosis not present

## 2022-07-14 DIAGNOSIS — Z0100 Encounter for examination of eyes and vision without abnormal findings: Secondary | ICD-10-CM | POA: Diagnosis not present

## 2022-07-14 DIAGNOSIS — E113293 Type 2 diabetes mellitus with mild nonproliferative diabetic retinopathy without macular edema, bilateral: Secondary | ICD-10-CM | POA: Diagnosis not present

## 2022-07-20 DIAGNOSIS — E7800 Pure hypercholesterolemia, unspecified: Secondary | ICD-10-CM | POA: Diagnosis not present

## 2022-07-20 DIAGNOSIS — Z0000 Encounter for general adult medical examination without abnormal findings: Secondary | ICD-10-CM | POA: Diagnosis not present

## 2022-07-20 DIAGNOSIS — K219 Gastro-esophageal reflux disease without esophagitis: Secondary | ICD-10-CM | POA: Diagnosis not present

## 2022-07-20 DIAGNOSIS — E1122 Type 2 diabetes mellitus with diabetic chronic kidney disease: Secondary | ICD-10-CM | POA: Diagnosis not present

## 2022-07-20 DIAGNOSIS — E1140 Type 2 diabetes mellitus with diabetic neuropathy, unspecified: Secondary | ICD-10-CM | POA: Diagnosis not present

## 2022-07-20 DIAGNOSIS — M130 Polyarthritis, unspecified: Secondary | ICD-10-CM | POA: Diagnosis not present

## 2022-07-20 DIAGNOSIS — Z1331 Encounter for screening for depression: Secondary | ICD-10-CM | POA: Diagnosis not present

## 2022-07-20 DIAGNOSIS — D472 Monoclonal gammopathy: Secondary | ICD-10-CM | POA: Diagnosis not present

## 2022-07-20 DIAGNOSIS — E1169 Type 2 diabetes mellitus with other specified complication: Secondary | ICD-10-CM | POA: Diagnosis not present

## 2022-07-27 DIAGNOSIS — Z8673 Personal history of transient ischemic attack (TIA), and cerebral infarction without residual deficits: Secondary | ICD-10-CM | POA: Diagnosis not present

## 2022-07-27 DIAGNOSIS — R9082 White matter disease, unspecified: Secondary | ICD-10-CM | POA: Diagnosis not present

## 2022-07-27 DIAGNOSIS — E1142 Type 2 diabetes mellitus with diabetic polyneuropathy: Secondary | ICD-10-CM | POA: Diagnosis not present

## 2022-07-27 DIAGNOSIS — R29898 Other symptoms and signs involving the musculoskeletal system: Secondary | ICD-10-CM | POA: Diagnosis not present

## 2022-07-27 DIAGNOSIS — M5136 Other intervertebral disc degeneration, lumbar region: Secondary | ICD-10-CM | POA: Diagnosis not present

## 2022-08-31 DIAGNOSIS — E785 Hyperlipidemia, unspecified: Secondary | ICD-10-CM | POA: Diagnosis not present

## 2022-08-31 DIAGNOSIS — Z794 Long term (current) use of insulin: Secondary | ICD-10-CM | POA: Diagnosis not present

## 2022-08-31 DIAGNOSIS — E119 Type 2 diabetes mellitus without complications: Secondary | ICD-10-CM | POA: Diagnosis not present

## 2022-08-31 DIAGNOSIS — E1159 Type 2 diabetes mellitus with other circulatory complications: Secondary | ICD-10-CM | POA: Diagnosis not present

## 2022-08-31 DIAGNOSIS — Z79899 Other long term (current) drug therapy: Secondary | ICD-10-CM | POA: Diagnosis not present

## 2022-08-31 DIAGNOSIS — I152 Hypertension secondary to endocrine disorders: Secondary | ICD-10-CM | POA: Diagnosis not present

## 2022-08-31 DIAGNOSIS — E1169 Type 2 diabetes mellitus with other specified complication: Secondary | ICD-10-CM | POA: Diagnosis not present

## 2022-09-14 DIAGNOSIS — I152 Hypertension secondary to endocrine disorders: Secondary | ICD-10-CM | POA: Diagnosis not present

## 2022-09-14 DIAGNOSIS — E119 Type 2 diabetes mellitus without complications: Secondary | ICD-10-CM | POA: Diagnosis not present

## 2022-09-14 DIAGNOSIS — E785 Hyperlipidemia, unspecified: Secondary | ICD-10-CM | POA: Diagnosis not present

## 2022-09-14 DIAGNOSIS — E1169 Type 2 diabetes mellitus with other specified complication: Secondary | ICD-10-CM | POA: Diagnosis not present

## 2022-09-14 DIAGNOSIS — Z794 Long term (current) use of insulin: Secondary | ICD-10-CM | POA: Diagnosis not present

## 2022-09-14 DIAGNOSIS — E1159 Type 2 diabetes mellitus with other circulatory complications: Secondary | ICD-10-CM | POA: Diagnosis not present

## 2022-09-21 DIAGNOSIS — E119 Type 2 diabetes mellitus without complications: Secondary | ICD-10-CM | POA: Diagnosis not present

## 2022-09-26 DIAGNOSIS — E1122 Type 2 diabetes mellitus with diabetic chronic kidney disease: Secondary | ICD-10-CM | POA: Diagnosis not present

## 2022-09-26 DIAGNOSIS — S81802A Unspecified open wound, left lower leg, initial encounter: Secondary | ICD-10-CM | POA: Diagnosis not present

## 2022-09-26 DIAGNOSIS — L089 Local infection of the skin and subcutaneous tissue, unspecified: Secondary | ICD-10-CM | POA: Diagnosis not present

## 2022-09-26 DIAGNOSIS — N183 Chronic kidney disease, stage 3 unspecified: Secondary | ICD-10-CM | POA: Diagnosis not present

## 2022-10-07 ENCOUNTER — Inpatient Hospital Stay: Payer: Medicare PPO | Attending: Oncology

## 2022-10-07 DIAGNOSIS — D472 Monoclonal gammopathy: Secondary | ICD-10-CM | POA: Diagnosis not present

## 2022-10-07 LAB — CBC WITH DIFFERENTIAL/PLATELET
Abs Immature Granulocytes: 0.01 10*3/uL (ref 0.00–0.07)
Basophils Absolute: 0 10*3/uL (ref 0.0–0.1)
Basophils Relative: 1 %
Eosinophils Absolute: 0.1 10*3/uL (ref 0.0–0.5)
Eosinophils Relative: 1 %
HCT: 38.5 % — ABNORMAL LOW (ref 39.0–52.0)
Hemoglobin: 13.1 g/dL (ref 13.0–17.0)
Immature Granulocytes: 0 %
Lymphocytes Relative: 26 %
Lymphs Abs: 1.7 10*3/uL (ref 0.7–4.0)
MCH: 30.6 pg (ref 26.0–34.0)
MCHC: 34 g/dL (ref 30.0–36.0)
MCV: 90 fL (ref 80.0–100.0)
Monocytes Absolute: 0.4 10*3/uL (ref 0.1–1.0)
Monocytes Relative: 6 %
Neutro Abs: 4.2 10*3/uL (ref 1.7–7.7)
Neutrophils Relative %: 66 %
Platelets: 214 10*3/uL (ref 150–400)
RBC: 4.28 MIL/uL (ref 4.22–5.81)
RDW: 12.8 % (ref 11.5–15.5)
WBC: 6.5 10*3/uL (ref 4.0–10.5)
nRBC: 0 % (ref 0.0–0.2)

## 2022-10-07 LAB — COMPREHENSIVE METABOLIC PANEL
ALT: 19 U/L (ref 0–44)
AST: 28 U/L (ref 15–41)
Albumin: 4.2 g/dL (ref 3.5–5.0)
Alkaline Phosphatase: 56 U/L (ref 38–126)
Anion gap: 7 (ref 5–15)
BUN: 23 mg/dL (ref 8–23)
CO2: 24 mmol/L (ref 22–32)
Calcium: 9.1 mg/dL (ref 8.9–10.3)
Chloride: 106 mmol/L (ref 98–111)
Creatinine, Ser: 1.23 mg/dL (ref 0.61–1.24)
GFR, Estimated: 59 mL/min — ABNORMAL LOW (ref >60)
Glucose, Bld: 148 mg/dL — ABNORMAL HIGH (ref 70–99)
Potassium: 4.1 mmol/L (ref 3.5–5.1)
Sodium: 137 mmol/L (ref 135–145)
Total Bilirubin: 0.6 mg/dL (ref 0.3–1.2)
Total Protein: 7.2 g/dL (ref 6.5–8.1)

## 2022-10-11 LAB — KAPPA/LAMBDA LIGHT CHAINS
Kappa free light chain: 307.9 mg/L — ABNORMAL HIGH (ref 3.3–19.4)
Kappa, lambda light chain ratio: 9.71 — ABNORMAL HIGH (ref 0.26–1.65)
Lambda free light chains: 31.7 mg/L — ABNORMAL HIGH (ref 5.7–26.3)

## 2022-10-14 ENCOUNTER — Ambulatory Visit: Payer: Medicare PPO | Admitting: Physician Assistant

## 2022-10-14 LAB — MULTIPLE MYELOMA PANEL, SERUM
Albumin SerPl Elph-Mcnc: 3.7 g/dL (ref 2.9–4.4)
Albumin/Glob SerPl: 1.4 (ref 0.7–1.7)
Alpha 1: 0.2 g/dL (ref 0.0–0.4)
Alpha2 Glob SerPl Elph-Mcnc: 0.7 g/dL (ref 0.4–1.0)
B-Globulin SerPl Elph-Mcnc: 0.9 g/dL (ref 0.7–1.3)
Gamma Glob SerPl Elph-Mcnc: 1 g/dL (ref 0.4–1.8)
Globulin, Total: 2.7 g/dL (ref 2.2–3.9)
IgA: 140 mg/dL (ref 61–437)
IgG (Immunoglobin G), Serum: 1067 mg/dL (ref 603–1613)
IgM (Immunoglobulin M), Srm: 91 mg/dL (ref 15–143)
M Protein SerPl Elph-Mcnc: 0.5 g/dL — ABNORMAL HIGH
Total Protein ELP: 6.4 g/dL (ref 6.0–8.5)

## 2022-10-18 ENCOUNTER — Inpatient Hospital Stay: Payer: Medicare PPO | Attending: Oncology | Admitting: Oncology

## 2022-10-18 ENCOUNTER — Encounter: Payer: Self-pay | Admitting: Oncology

## 2022-10-18 DIAGNOSIS — I129 Hypertensive chronic kidney disease with stage 1 through stage 4 chronic kidney disease, or unspecified chronic kidney disease: Secondary | ICD-10-CM | POA: Diagnosis not present

## 2022-10-18 DIAGNOSIS — D472 Monoclonal gammopathy: Secondary | ICD-10-CM | POA: Diagnosis not present

## 2022-10-18 DIAGNOSIS — N189 Chronic kidney disease, unspecified: Secondary | ICD-10-CM | POA: Diagnosis not present

## 2022-10-18 DIAGNOSIS — E1122 Type 2 diabetes mellitus with diabetic chronic kidney disease: Secondary | ICD-10-CM | POA: Insufficient documentation

## 2022-10-18 NOTE — Assessment & Plan Note (Addendum)
IgG kappa MGUS Labs are reviewed and discussed with patient. M protein increased slightly, light chain ratio has increased. Check 24 hour UPEP We dicussed about possible need of bone marrow biopsy in the near future.  He has normal hemoglobin, and kidney function. Observation.  Check skeletal survey.  Follow up in 6 months.

## 2022-10-18 NOTE — Progress Notes (Signed)
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 M protein increased slightly, light chain ratio has increased. Check 24 hour UPEP We dicussed about possible need of bone marrow biopsy in the near future.  He has normal hemoglobin, and kidney function. Observation.  Check skeletal survey.  Follow up in 6 months.   Orders Placed This Encounter  Procedures   DG Bone Survey Met    Standing Status:   Future    Standing Expiration Date:   10/19/2023    Order Specific Question:   Reason for Exam (SYMPTOM  OR DIAGNOSIS REQUIRED)    Answer:   MGUS    Order Specific Question:   Preferred imaging location?    Answer:   [facility]   CBC with Differential/Platelet    Standing Status:   Future    Standing Expiration Date:   10/18/2023   Comprehensive metabolic panel    Standing Status:   Future    Standing Expiration Date:   10/18/2023   Multiple Myeloma Panel (SPEP&IFE w/QIG)    Standing Status:   Future    Standing Expiration Date:   10/18/2023   Kappa/lambda light chains    Standing Status:   Future    Standing Expiration Date:   10/18/2023   IFE+PROTEIN ELECTRO, 24-HR UR    Standing Status:   Future    Standing Expiration Date:   10/19/2023   Follow up in 6 months.   All questions were answered. The patient knows to call the clinic with any problems, questions or concerns.  Earlie Server, MD, PhD [facility] Hematology Oncology 10/18/2022    CHIEF COMPLAINTS/REASON FOR VISIT:  Follow up for MGUS  HISTORY OF PRESENTING ILLNESS:   Terry Vazquez is a  82 y.o.  male presents for follow up of MGUS.   Patient recently  was seen by neurology for left leg weakness, back pain, diabetic polyneuropathy.  He had work up done.  08/20/2021 SPEP showed M spike of 0.5g/dl, IgG kappa.  09/14/2021 Free kappa light chain 132.9, lamda light chain 36.9, kappa/lamda ratio 3.6 He was referred to hematology for further evaluation.    INTERVAL HISTORY Patient feels well today. No new complaints.    Review of Systems  Constitutional:  Negative for appetite change, chills, fatigue, fever and unexpected weight change.  HENT:   Negative for hearing loss and voice change.   Eyes:  Negative for eye problems and icterus.  Respiratory:  Negative for chest tightness, cough and shortness of breath.   Cardiovascular:  Negative for chest pain and leg swelling.  Gastrointestinal:  Negative for abdominal distention and abdominal pain.  Endocrine: Negative for hot flashes.  Genitourinary:  Negative for difficulty urinating, dysuria and frequency.   Musculoskeletal:  Negative for arthralgias.  Skin:  Negative for itching and rash.  Neurological:  Positive for extremity weakness. Negative for light-headedness and numbness.  Hematological:  Negative for adenopathy. Does not bruise/bleed easily.  Psychiatric/Behavioral:  Negative for confusion.     MEDICAL HISTORY:  Past Medical History:  Diagnosis Date   Cataract extraction status of right eye    CKD (chronic kidney disease)    Diabetes  mellitus without complication ([facility])    Hyperlipidemia    Hypertension    Hypertension associated with diabetes ([place])    Migraine without status migrainosus, not intractable    Other seasonal allergic rhinitis    Puckering of macula    Vitreous degeneration of left eye     SURGICAL HISTORY: Past Surgical History:  Procedure Laterality Date   CATARACT EXTRACTION, BILATERAL     Left Ring and Long Trigger Finger Release     TONSILLECTOMY     TONSILLECTOMY AND ADENOIDECTOMY     TOOTH EXTRACTION      SOCIAL HISTORY: Social History    Socioeconomic History   Marital status: Married    Spouse name: Not on file   Number of children: Not on file   Years of education: Not on file   Highest education level: Not on file  Occupational History   Not on file  Tobacco Use   Smoking status: Never   Smokeless tobacco: Never  Vaping Use   Vaping Use: Never used  Substance and Sexual Activity   Alcohol use: Never   Drug use: Never   Sexual activity: Not on file  Other Topics Concern   Not on file  Social History Narrative   Not on file   Social Determinants of Health   Financial Resource Strain: Not on file  Food Insecurity: Not on file  Transportation Needs: Not on file  Physical Activity: Not on file  Stress: Not on file  Social Connections: Not on file  Intimate Partner Violence: Not on file    FAMILY HISTORY: Family History  Problem Relation Age of Onset   Diabetes Mother    Parkinson's disease Mother    Heart failure Father    Kidney disease Father    Rheum arthritis Sister    Diabetes Brother    Heart failure Brother    Arthritis Brother    Diabetes Other        multiple cousins with diabetes    ALLERGIES:  has No Known Allergies.  MEDICATIONS:  Current Outpatient Medications  Medication Sig Dispense Refill   aspirin EC 81 MG tablet Take 81 mg by mouth daily.      atorvastatin (LIPITOR) 20 MG tablet Take 20 mg by mouth daily.      benazepril (LOTENSIN) 20 MG tablet Take 20 mg by mouth daily.      Calcium Carbonate-Vit D-Min (CALCIUM 1200 PO) Take 1 tablet by mouth 3 (three) times a week.     cyanocobalamin (VITAMIN B12) 1000 MCG tablet Take 1,000 mcg by mouth daily.     Dulaglutide 0.75 MG/0.5ML SOPN Inject 0.75 mg into the skin once a week.      famotidine (PEPCID) 20 MG tablet Take 20 mg by mouth daily.     glucosamine-chondroitin 500-400 MG tablet Take 1 tablet by mouth daily.      Insulin Degludec (TRESIBA FLEXTOUCH [place]) Inject 13 Units into the skin at bedtime.     Loratadine 10 MG  CAPS Take 10 mg by mouth daily.      metFORMIN (GLUCOPHAGE-XR) 500 MG 24 hr tablet Take 500 mg by mouth 2 (two) times daily.     OVER THE COUNTER MEDICATION NATURAL BLACK CHERRY CONCENTRATE     repaglinide (PRANDIN) 1 MG tablet Take 1 tablet by mouth 3 (three) times daily before meals.     omeprazole (PRILOSEC) 20 MG capsule Take by mouth. (Patient not taking: Reported on 10/18/2022)     No current  facility-administered medications for this visit.     PHYSICAL EXAMINATION: ECOG PERFORMANCE STATUS: 1 - Symptomatic but completely ambulatory Vitals:   10/18/22 1424  BP: (!) 147/63  Pulse: 63  Resp: 18  Temp: (!) 97.5 F (36.4 C)   Filed Weights   10/18/22 1424  Weight: 141 lb 8 oz (64.2 kg)    Physical Exam Constitutional:      General: He is not in acute distress. HENT:     Head: Normocephalic and atraumatic.  Eyes:     General: No scleral icterus. Cardiovascular:     Rate and Rhythm: Normal rate and regular rhythm.     Heart sounds: Normal heart sounds.  Pulmonary:     Effort: Pulmonary effort is normal. No respiratory distress.     Breath sounds: No wheezing.  Abdominal:     General: Bowel sounds are normal. There is no distension.     Palpations: Abdomen is soft.  Musculoskeletal:        General: No deformity. Normal range of motion.     Cervical back: Normal range of motion and neck supple.  Skin:    General: Skin is warm and dry.     Findings: No erythema or rash.  Neurological:     Mental Status: He is alert and oriented to person, place, and time. Mental status is at baseline.     Cranial Nerves: No cranial nerve deficit.     Coordination: Coordination normal.  Psychiatric:        Mood and Affect: Mood normal.     LABORATORY DATA:  I have reviewed the data as listed    Latest Ref Rng & Units 10/07/2022    1:37 PM 03/31/2022   12:56 PM 10/07/2021    4:12 PM  CBC  WBC 4.0 - 10.5 K/uL 6.5  5.4  6.3   Hemoglobin 13.0 - 17.0 g/dL 13.1  13.0  13.5    Hematocrit 39.0 - 52.0 % 38.5  38.6  39.9   Platelets 150 - 400 K/uL 214  181  185       Latest Ref Rng & Units 10/07/2022    1:37 PM 03/31/2022   12:56 PM 10/07/2021    4:12 PM  CMP  Glucose 70 - 99 mg/dL 148  203  218   BUN 8 - 23 mg/dL '23  19  25   '$ Creatinine 0.61 - 1.24 mg/dL 1.23  1.17  1.18   Sodium 135 - 145 mmol/L 137  134  137   Potassium 3.5 - 5.1 mmol/L 4.1  3.9  4.1   Chloride 98 - 111 mmol/L 106  104  102   CO2 22 - 32 mmol/L '24  23  25   '$ Calcium 8.9 - 10.3 mg/dL 9.1  8.9  9.2   Total Protein 6.5 - 8.1 g/dL 7.2  6.7  6.8   Total Bilirubin 0.3 - 1.2 mg/dL 0.6  0.6  0.6   Alkaline Phos 38 - 126 U/L 56  68  49   AST 15 - 41 U/L 28  36  31   ALT 0 - 44 U/L 19  32  27      RADIOGRAPHIC STUDIES: I have personally reviewed the radiological images as listed and agreed with the findings in the report. No results found.

## 2022-10-22 DIAGNOSIS — E119 Type 2 diabetes mellitus without complications: Secondary | ICD-10-CM | POA: Diagnosis not present

## 2022-10-26 ENCOUNTER — Ambulatory Visit
Admission: RE | Admit: 2022-10-26 | Discharge: 2022-10-26 | Disposition: A | Discharge status: Home / Self Care | Payer: Medicare PPO | Source: Ambulatory Visit | Attending: Oncology | Admitting: Oncology

## 2022-10-26 ENCOUNTER — Other Ambulatory Visit: Payer: Self-pay

## 2022-10-26 DIAGNOSIS — E1122 Type 2 diabetes mellitus with diabetic chronic kidney disease: Secondary | ICD-10-CM | POA: Diagnosis not present

## 2022-10-26 DIAGNOSIS — D472 Monoclonal gammopathy: Secondary | ICD-10-CM

## 2022-10-26 DIAGNOSIS — N189 Chronic kidney disease, unspecified: Secondary | ICD-10-CM | POA: Diagnosis not present

## 2022-10-26 DIAGNOSIS — I129 Hypertensive chronic kidney disease with stage 1 through stage 4 chronic kidney disease, or unspecified chronic kidney disease: Secondary | ICD-10-CM | POA: Diagnosis not present

## 2022-10-27 LAB — IFE+PROTEIN ELECTRO, 24-HR UR
% BETA, Urine: 81.8 %
ALPHA 1 URINE: 1.6 %
Albumin, U: 5.6 %
Alpha 2, Urine: 2.5 %
GAMMA GLOBULIN URINE: 8.6 %
M-SPIKE %, Urine: 60.5 % — ABNORMAL HIGH
M-Spike, Mg/24 Hr: 159 mg/24 hr — ABNORMAL HIGH
Total Protein, Urine-Ur/day: 262 mg/24 hr — ABNORMAL HIGH (ref 30–150)
Total Protein, Urine: 13.1 mg/dL
Total Volume: 2000

## 2022-11-22 DIAGNOSIS — E119 Type 2 diabetes mellitus without complications: Secondary | ICD-10-CM | POA: Diagnosis not present

## 2022-12-21 DIAGNOSIS — E119 Type 2 diabetes mellitus without complications: Secondary | ICD-10-CM | POA: Diagnosis not present

## 2022-12-27 DIAGNOSIS — R29898 Other symptoms and signs involving the musculoskeletal system: Secondary | ICD-10-CM | POA: Diagnosis not present

## 2022-12-27 DIAGNOSIS — E1142 Type 2 diabetes mellitus with diabetic polyneuropathy: Secondary | ICD-10-CM | POA: Diagnosis not present

## 2022-12-27 DIAGNOSIS — M5136 Other intervertebral disc degeneration, lumbar region: Secondary | ICD-10-CM | POA: Diagnosis not present

## 2022-12-27 DIAGNOSIS — R1310 Dysphagia, unspecified: Secondary | ICD-10-CM | POA: Diagnosis not present

## 2023-01-17 DIAGNOSIS — Z794 Long term (current) use of insulin: Secondary | ICD-10-CM | POA: Diagnosis not present

## 2023-01-17 DIAGNOSIS — E119 Type 2 diabetes mellitus without complications: Secondary | ICD-10-CM | POA: Diagnosis not present

## 2023-01-17 DIAGNOSIS — E7800 Pure hypercholesterolemia, unspecified: Secondary | ICD-10-CM | POA: Diagnosis not present

## 2023-01-17 DIAGNOSIS — Z79899 Other long term (current) drug therapy: Secondary | ICD-10-CM | POA: Diagnosis not present

## 2023-01-21 DIAGNOSIS — E119 Type 2 diabetes mellitus without complications: Secondary | ICD-10-CM | POA: Diagnosis not present

## 2023-01-24 DIAGNOSIS — M129 Arthropathy, unspecified: Secondary | ICD-10-CM | POA: Diagnosis not present

## 2023-01-24 DIAGNOSIS — Z794 Long term (current) use of insulin: Secondary | ICD-10-CM | POA: Diagnosis not present

## 2023-01-24 DIAGNOSIS — D472 Monoclonal gammopathy: Secondary | ICD-10-CM | POA: Diagnosis not present

## 2023-01-24 DIAGNOSIS — K219 Gastro-esophageal reflux disease without esophagitis: Secondary | ICD-10-CM | POA: Diagnosis not present

## 2023-01-24 DIAGNOSIS — E1140 Type 2 diabetes mellitus with diabetic neuropathy, unspecified: Secondary | ICD-10-CM | POA: Diagnosis not present

## 2023-01-24 DIAGNOSIS — N401 Enlarged prostate with lower urinary tract symptoms: Secondary | ICD-10-CM | POA: Diagnosis not present

## 2023-01-24 DIAGNOSIS — E1159 Type 2 diabetes mellitus with other circulatory complications: Secondary | ICD-10-CM | POA: Diagnosis not present

## 2023-01-24 DIAGNOSIS — E1122 Type 2 diabetes mellitus with diabetic chronic kidney disease: Secondary | ICD-10-CM | POA: Diagnosis not present

## 2023-01-24 DIAGNOSIS — E7800 Pure hypercholesterolemia, unspecified: Secondary | ICD-10-CM | POA: Diagnosis not present

## 2023-02-20 DIAGNOSIS — E119 Type 2 diabetes mellitus without complications: Secondary | ICD-10-CM | POA: Diagnosis not present

## 2023-03-02 DIAGNOSIS — E119 Type 2 diabetes mellitus without complications: Secondary | ICD-10-CM | POA: Diagnosis not present

## 2023-03-02 DIAGNOSIS — E785 Hyperlipidemia, unspecified: Secondary | ICD-10-CM | POA: Diagnosis not present

## 2023-03-02 DIAGNOSIS — E1159 Type 2 diabetes mellitus with other circulatory complications: Secondary | ICD-10-CM | POA: Diagnosis not present

## 2023-03-02 DIAGNOSIS — I152 Hypertension secondary to endocrine disorders: Secondary | ICD-10-CM | POA: Diagnosis not present

## 2023-03-02 DIAGNOSIS — E1169 Type 2 diabetes mellitus with other specified complication: Secondary | ICD-10-CM | POA: Diagnosis not present

## 2023-03-02 DIAGNOSIS — Z794 Long term (current) use of insulin: Secondary | ICD-10-CM | POA: Diagnosis not present

## 2023-03-23 DIAGNOSIS — E119 Type 2 diabetes mellitus without complications: Secondary | ICD-10-CM | POA: Diagnosis not present

## 2023-04-10 DIAGNOSIS — K219 Gastro-esophageal reflux disease without esophagitis: Secondary | ICD-10-CM | POA: Diagnosis not present

## 2023-04-10 DIAGNOSIS — R1319 Other dysphagia: Secondary | ICD-10-CM | POA: Diagnosis not present

## 2023-04-18 ENCOUNTER — Other Ambulatory Visit: Payer: Self-pay

## 2023-04-18 ENCOUNTER — Inpatient Hospital Stay: Payer: Medicare PPO | Attending: Oncology

## 2023-04-18 DIAGNOSIS — M549 Dorsalgia, unspecified: Secondary | ICD-10-CM | POA: Diagnosis not present

## 2023-04-18 DIAGNOSIS — D472 Monoclonal gammopathy: Secondary | ICD-10-CM | POA: Insufficient documentation

## 2023-04-18 DIAGNOSIS — E1142 Type 2 diabetes mellitus with diabetic polyneuropathy: Secondary | ICD-10-CM | POA: Insufficient documentation

## 2023-04-18 DIAGNOSIS — R531 Weakness: Secondary | ICD-10-CM | POA: Insufficient documentation

## 2023-04-18 LAB — COMPREHENSIVE METABOLIC PANEL
ALT: 18 U/L (ref 0–44)
AST: 25 U/L (ref 15–41)
Albumin: 3.9 g/dL (ref 3.5–5.0)
Alkaline Phosphatase: 46 U/L (ref 38–126)
Anion gap: 9 (ref 5–15)
BUN: 25 mg/dL — ABNORMAL HIGH (ref 8–23)
CO2: 20 mmol/L — ABNORMAL LOW (ref 22–32)
Calcium: 8.7 mg/dL — ABNORMAL LOW (ref 8.9–10.3)
Chloride: 102 mmol/L (ref 98–111)
Creatinine, Ser: 1.2 mg/dL (ref 0.61–1.24)
GFR, Estimated: >60 mL/min (ref >60)
Glucose, Bld: 227 mg/dL — ABNORMAL HIGH (ref 70–99)
Potassium: 4.1 mmol/L (ref 3.5–5.1)
Sodium: 131 mmol/L — ABNORMAL LOW (ref 135–145)
Total Bilirubin: 0.5 mg/dL (ref 0.3–1.2)
Total Protein: 6.7 g/dL (ref 6.5–8.1)

## 2023-04-18 LAB — CBC WITH DIFFERENTIAL/PLATELET
Abs Immature Granulocytes: 0 10*3/uL (ref 0.00–0.07)
Basophils Absolute: 0 10*3/uL (ref 0.0–0.1)
Basophils Relative: 0 %
Eosinophils Absolute: 0.1 10*3/uL (ref 0.0–0.5)
Eosinophils Relative: 1 %
HCT: 36.1 % — ABNORMAL LOW (ref 39.0–52.0)
Hemoglobin: 12.4 g/dL — ABNORMAL LOW (ref 13.0–17.0)
Immature Granulocytes: 0 %
Lymphocytes Relative: 29 %
Lymphs Abs: 1.7 10*3/uL (ref 0.7–4.0)
MCH: 30.8 pg (ref 26.0–34.0)
MCHC: 34.3 g/dL (ref 30.0–36.0)
MCV: 89.8 fL (ref 80.0–100.0)
Monocytes Absolute: 0.4 10*3/uL (ref 0.1–1.0)
Monocytes Relative: 7 %
Neutro Abs: 3.6 10*3/uL (ref 1.7–7.7)
Neutrophils Relative %: 63 %
Platelets: 181 10*3/uL (ref 150–400)
RBC: 4.02 MIL/uL — ABNORMAL LOW (ref 4.22–5.81)
RDW: 12.8 % (ref 11.5–15.5)
WBC: 5.8 10*3/uL (ref 4.0–10.5)
nRBC: 0 % (ref 0.0–0.2)

## 2023-04-19 LAB — KAPPA/LAMBDA LIGHT CHAINS
Kappa free light chain: 408.8 mg/L — ABNORMAL HIGH (ref 3.3–19.4)
Kappa, lambda light chain ratio: 12.06 — ABNORMAL HIGH (ref 0.26–1.65)
Lambda free light chains: 33.9 mg/L — ABNORMAL HIGH (ref 5.7–26.3)

## 2023-04-22 DIAGNOSIS — E119 Type 2 diabetes mellitus without complications: Secondary | ICD-10-CM | POA: Diagnosis not present

## 2023-04-24 ENCOUNTER — Other Ambulatory Visit: Payer: Self-pay | Admitting: Gastroenterology

## 2023-04-24 DIAGNOSIS — R1319 Other dysphagia: Secondary | ICD-10-CM

## 2023-04-24 DIAGNOSIS — K219 Gastro-esophageal reflux disease without esophagitis: Secondary | ICD-10-CM

## 2023-04-24 LAB — MULTIPLE MYELOMA PANEL, SERUM
Albumin SerPl Elph-Mcnc: 3.7 g/dL (ref 2.9–4.4)
Albumin/Glob SerPl: 1.5 (ref 0.7–1.7)
Alpha 1: 0.2 g/dL (ref 0.0–0.4)
Alpha2 Glob SerPl Elph-Mcnc: 0.6 g/dL (ref 0.4–1.0)
B-Globulin SerPl Elph-Mcnc: 0.8 g/dL (ref 0.7–1.3)
Gamma Glob SerPl Elph-Mcnc: 1 g/dL (ref 0.4–1.8)
Globulin, Total: 2.6 g/dL (ref 2.2–3.9)
IgA: 130 mg/dL (ref 61–437)
IgG (Immunoglobin G), Serum: 1017 mg/dL (ref 603–1613)
IgM (Immunoglobulin M), Srm: 71 mg/dL (ref 15–143)
M Protein SerPl Elph-Mcnc: 0.7 g/dL — ABNORMAL HIGH
Total Protein ELP: 6.3 g/dL (ref 6.0–8.5)

## 2023-04-26 ENCOUNTER — Other Ambulatory Visit: Payer: Self-pay

## 2023-04-26 DIAGNOSIS — M549 Dorsalgia, unspecified: Secondary | ICD-10-CM | POA: Diagnosis not present

## 2023-04-26 DIAGNOSIS — E1142 Type 2 diabetes mellitus with diabetic polyneuropathy: Secondary | ICD-10-CM | POA: Diagnosis not present

## 2023-04-26 DIAGNOSIS — R531 Weakness: Secondary | ICD-10-CM | POA: Diagnosis not present

## 2023-04-26 DIAGNOSIS — D472 Monoclonal gammopathy: Secondary | ICD-10-CM

## 2023-04-28 ENCOUNTER — Encounter: Payer: Self-pay | Admitting: Oncology

## 2023-04-28 ENCOUNTER — Inpatient Hospital Stay: Payer: Medicare PPO | Admitting: Oncology

## 2023-04-28 VITALS — BP 136/69 | HR 56 | Temp 96.0°F | Resp 18 | Wt 138.7 lb

## 2023-04-28 DIAGNOSIS — M549 Dorsalgia, unspecified: Secondary | ICD-10-CM | POA: Diagnosis not present

## 2023-04-28 DIAGNOSIS — E1142 Type 2 diabetes mellitus with diabetic polyneuropathy: Secondary | ICD-10-CM | POA: Diagnosis not present

## 2023-04-28 DIAGNOSIS — R531 Weakness: Secondary | ICD-10-CM | POA: Diagnosis not present

## 2023-04-28 DIAGNOSIS — D472 Monoclonal gammopathy: Secondary | ICD-10-CM | POA: Diagnosis not present

## 2023-04-28 LAB — IFE+PROTEIN ELECTRO, 24-HR UR
% BETA, Urine: 81.6 %
ALPHA 1 URINE: 2.3 %
Albumin, U: 5 %
Alpha 2, Urine: 6.6 %
GAMMA GLOBULIN URINE: 4.4 %
M-SPIKE %, Urine: 62.8 % — ABNORMAL HIGH
M-Spike, Mg/24 Hr: 119 mg/24 hr — ABNORMAL HIGH
Total Protein, Urine-Ur/day: 189 mg/24 hr — ABNORMAL HIGH (ref 30–150)
Total Protein, Urine: 10.5 mg/dL
Total Volume: 1800

## 2023-04-28 NOTE — Progress Notes (Signed)
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 M protein increased slightly, light chain ratio has increased. Check 24 hour UPEP We dicussed about possible need of bone marrow biopsy in the near future.  He has slightly decreased hemoglobin, and normal kidney function.  Continue observation.  January 2024 negative skeletal survey.  Follow up in 4 months.   Orders Placed This Encounter  Procedures   CBC with Differential (Cancer Center Only)    Standing Status:   Future    Standing Expiration Date:   04/27/2024   CMP (Cancer Center only)    Standing Status:   Future    Standing Expiration Date:   04/27/2024   Multiple Myeloma Panel (SPEP&IFE w/QIG)    Standing Status:   Future    Standing Expiration Date:   04/27/2024   Kappa/lambda light chains    Standing Status:   Future    Standing Expiration Date:   04/27/2024   Follow up in 4 months.   All questions were answered. The patient knows to call the clinic with any problems, questions or concerns.  Rickard Patience, MD, PhD [facility] Hematology Oncology 04/28/2023    CHIEF COMPLAINTS/REASON FOR VISIT:  Follow up for MGUS  HISTORY OF PRESENTING ILLNESS:   Terry Vazquez is a  82 y.o.  male presents for follow up of MGUS.   Patient recently was seen by neurology for left leg weakness, back pain, diabetic polyneuropathy.  He had work up done.  08/20/2021 SPEP showed M spike of 0.5g/dl, IgG kappa.  09/14/2021 Free kappa light chain 132.9, lamda light chain 36.9, kappa/lamda ratio 3.6 He was referred to hematology for further evaluation.    INTERVAL  HISTORY Patient feels well today. No new complaints.  Accompanied by his wife.   Review of Systems  Constitutional:  Negative for appetite change, chills, fatigue, fever and unexpected weight change.  HENT:   Negative for hearing loss and voice change.   Eyes:  Negative for eye problems and icterus.  Respiratory:  Negative for chest tightness, cough and shortness of breath.   Cardiovascular:  Negative for chest pain and leg swelling.  Gastrointestinal:  Negative for abdominal distention and abdominal pain.  Endocrine: Negative for hot flashes.  Genitourinary:  Negative for difficulty urinating, dysuria and frequency.   Musculoskeletal:  Negative for arthralgias.  Skin:  Negative for itching and rash.  Neurological:  Positive for extremity weakness. Negative for light-headedness and numbness.  Hematological:  Negative for adenopathy. Does not bruise/bleed easily.  Psychiatric/Behavioral:  Negative for confusion.     MEDICAL HISTORY:  Past Medical History:  Diagnosis Date   Cataract extraction status of right eye    CKD (chronic kidney disease)    Diabetes mellitus without complication (HCC)    Hyperlipidemia    Hypertension    Hypertension associated with diabetes (HCC)    Migraine without status migrainosus, not intractable    Other seasonal allergic rhinitis    Puckering of macula    Vitreous degeneration of left eye     SURGICAL HISTORY: Past Surgical History:  Procedure Laterality Date   CATARACT EXTRACTION, BILATERAL     Left Ring  and Long Trigger Finger Release     TONSILLECTOMY     TONSILLECTOMY AND ADENOIDECTOMY     TOOTH EXTRACTION      SOCIAL HISTORY: Social History   Socioeconomic History   Marital status: Married    Spouse name: Not on file   Number of children: Not on file   Years of education: Not on file   Highest education level: Not on file  Occupational History   Not on file  Tobacco Use   Smoking status: Never   Smokeless tobacco: Never   Vaping Use   Vaping status: Never Used  Substance and Sexual Activity   Alcohol use: Never   Drug use: Never   Sexual activity: Not on file  Other Topics Concern   Not on file  Social History Narrative   Not on file   Social Determinants of Health   Financial Resource Strain: Not on file  Food Insecurity: Not on file  Transportation Needs: Not on file  Physical Activity: Not on file  Stress: Not on file  Social Connections: Not on file  Intimate Partner Violence: Not on file    FAMILY HISTORY: Family History  Problem Relation Age of Onset   Diabetes Mother    Parkinson's disease Mother    Heart failure Father    Kidney disease Father    Rheum arthritis Sister    Diabetes Brother    Heart failure Brother    Arthritis Brother    Diabetes Other        multiple cousins with diabetes    ALLERGIES:  has No Known Allergies.  MEDICATIONS:  Current Outpatient Medications  Medication Sig Dispense Refill   aspirin EC 81 MG tablet Take 81 mg by mouth daily.      atorvastatin (LIPITOR) 20 MG tablet Take 20 mg by mouth daily.      benazepril (LOTENSIN) 20 MG tablet Take 20 mg by mouth daily.      Calcium Carbonate-Vit D-Min (CALCIUM 1200 PO) Take 1 tablet by mouth 3 (three) times a week.     cyanocobalamin (VITAMIN B12) 1000 MCG tablet Take 1,000 mcg by mouth daily.     Dulaglutide 0.75 MG/0.5ML SOPN Inject 0.75 mg into the skin once a week.      famotidine (PEPCID) 20 MG tablet Take 20 mg by mouth daily.     glucosamine-chondroitin 500-400 MG tablet Take 1 tablet by mouth daily.      Insulin Degludec (TRESIBA FLEXTOUCH [place]) Inject 13 Units into the skin at bedtime.     Loratadine 10 MG CAPS Take 10 mg by mouth daily.      metFORMIN (GLUCOPHAGE-XR) 500 MG 24 hr tablet Take 500 mg by mouth 2 (two) times daily.     omeprazole (PRILOSEC) 20 MG capsule Take by mouth.     OVER THE COUNTER MEDICATION NATURAL BLACK CHERRY CONCENTRATE     repaglinide (PRANDIN) 1 MG tablet Take 1  tablet by mouth 3 (three) times daily before meals.     No current facility-administered medications for this visit.     PHYSICAL EXAMINATION: ECOG PERFORMANCE STATUS: 1 - Symptomatic but completely ambulatory Vitals:   04/28/23 1215  BP: 136/69  Pulse: (!) 56  Resp: 18  Temp: (!) 96 F (35.6 C)  SpO2: 100%   Filed Weights   04/28/23 1215  Weight: 138 lb 11.2 oz (62.9 kg)    Physical Exam Constitutional:      General: He is not in acute distress. HENT:  Head: Normocephalic and atraumatic.  Eyes:     General: No scleral icterus. Cardiovascular:     Rate and Rhythm: Normal rate and regular rhythm.     Heart sounds: Normal heart sounds.  Pulmonary:     Effort: Pulmonary effort is normal. No respiratory distress.     Breath sounds: No wheezing.  Abdominal:     General: Bowel sounds are normal. There is no distension.     Palpations: Abdomen is soft.  Musculoskeletal:        General: No deformity. Normal range of motion.     Cervical back: Normal range of motion and neck supple.  Skin:    General: Skin is warm and dry.     Findings: No erythema or rash.  Neurological:     Mental Status: He is alert and oriented to person, place, and time. Mental status is at baseline.     Cranial Nerves: No cranial nerve deficit.     Coordination: Coordination normal.  Psychiatric:        Mood and Affect: Mood normal.     LABORATORY DATA:  I have reviewed the data as listed    Latest Ref Rng & Units 04/18/2023   12:42 PM 10/07/2022    1:37 PM 03/31/2022   12:56 PM  CBC  WBC 4.0 - 10.5 K/uL 5.8  6.5  5.4   Hemoglobin 13.0 - 17.0 g/dL 16.1  09.6  04.5   Hematocrit 39.0 - 52.0 % 36.1  38.5  38.6   Platelets 150 - 400 K/uL 181  214  181       Latest Ref Rng & Units 04/18/2023   12:42 PM 10/07/2022    1:37 PM 03/31/2022   12:56 PM  CMP  Glucose 70 - 99 mg/dL 409  811  914   BUN 8 - 23 mg/dL 25  23  19    Creatinine 0.61 - 1.24 mg/dL 7.82  9.56  2.13   Sodium 135 - 145  mmol/L 131  137  134   Potassium 3.5 - 5.1 mmol/L 4.1  4.1  3.9   Chloride 98 - 111 mmol/L 102  106  104   CO2 22 - 32 mmol/L 20  24  23    Calcium 8.9 - 10.3 mg/dL 8.7  9.1  8.9   Total Protein 6.5 - 8.1 g/dL 6.7  7.2  6.7   Total Bilirubin 0.3 - 1.2 mg/dL 0.5  0.6  0.6   Alkaline Phos 38 - 126 U/L 46  56  68   AST 15 - 41 U/L 25  28  36   ALT 0 - 44 U/L 18  19  32      RADIOGRAPHIC STUDIES: I have personally reviewed the radiological images as listed and agreed with the findings in the report. No results found.

## 2023-04-28 NOTE — Assessment & Plan Note (Signed)
IgG kappa MGUS Labs are reviewed and discussed with patient. M protein increased slightly, light chain ratio has increased. Check 24 hour UPEP We dicussed about possible need of bone marrow biopsy in the near future.  He has slightly decreased hemoglobin, and normal kidney function.  Continue observation.  January 2024 negative skeletal survey.  Follow up in 4 months.

## 2023-05-01 ENCOUNTER — Telehealth: Payer: Self-pay

## 2023-05-01 NOTE — Telephone Encounter (Signed)
Called pt and left VM with results as well.

## 2023-05-01 NOTE — Telephone Encounter (Signed)
-----   Message from Rickard Patience sent at 04/29/2023  3:23 PM EDT ----- My chart message sent. Thanks.

## 2023-05-02 ENCOUNTER — Ambulatory Visit
Admission: RE | Admit: 2023-05-02 | Discharge: 2023-05-02 | Disposition: A | Discharge status: Home / Self Care | Payer: Medicare PPO | Source: Ambulatory Visit | Attending: Gastroenterology | Admitting: Gastroenterology

## 2023-05-02 DIAGNOSIS — R1319 Other dysphagia: Secondary | ICD-10-CM | POA: Diagnosis not present

## 2023-05-02 DIAGNOSIS — K219 Gastro-esophageal reflux disease without esophagitis: Secondary | ICD-10-CM | POA: Insufficient documentation

## 2023-05-02 DIAGNOSIS — R638 Other symptoms and signs concerning food and fluid intake: Secondary | ICD-10-CM | POA: Diagnosis not present

## 2023-05-08 ENCOUNTER — Other Ambulatory Visit: Payer: Self-pay | Admitting: Gastroenterology

## 2023-05-08 DIAGNOSIS — T17900S Unspecified foreign body in respiratory tract, part unspecified causing asphyxiation, sequela: Secondary | ICD-10-CM

## 2023-05-08 DIAGNOSIS — R1319 Other dysphagia: Secondary | ICD-10-CM

## 2023-05-23 DIAGNOSIS — E119 Type 2 diabetes mellitus without complications: Secondary | ICD-10-CM | POA: Diagnosis not present

## 2023-05-24 ENCOUNTER — Ambulatory Visit
Admission: RE | Admit: 2023-05-24 | Discharge: 2023-05-24 | Disposition: A | Discharge status: Home / Self Care | Payer: Medicare PPO | Source: Ambulatory Visit | Attending: Gastroenterology | Admitting: Gastroenterology

## 2023-05-24 DIAGNOSIS — R1319 Other dysphagia: Secondary | ICD-10-CM | POA: Diagnosis not present

## 2023-05-24 DIAGNOSIS — T17900S Unspecified foreign body in respiratory tract, part unspecified causing asphyxiation, sequela: Secondary | ICD-10-CM | POA: Diagnosis not present

## 2023-05-24 DIAGNOSIS — R1310 Dysphagia, unspecified: Secondary | ICD-10-CM | POA: Diagnosis not present

## 2023-05-24 NOTE — Progress Notes (Addendum)
Modified Barium Swallow Study  Patient Details  Name: Terry Vazquez MRN: [id number] Date of Birth: [birth year]  Today's Date: 05/24/2023  Modified Barium Swallow completed.  Full report located under Chart Review in the Imaging Section.  History of Present Illness Per chart review, pt is an 82 yo male followed by oncology for MGUS (monoclonal gammopathy of unknown significance); pt has a baseline of Neuropathy, HTN, DM2, renal impairment. Pt endorsed a "mini" stroke ~2-3 years ago.  MRI 2023: Mild-to-moderate chronic microvascular ischemic changes of the  white matter and parenchymal volume loss.  3. 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    No acute pulmonary issues; no Chest/CXR Imaging per chart.    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  He is currently on a PPI 1x daily.   He c/o sinus drainage, sometimes worse in the mornings.  Of Note, pt had a DG Esophagus on 04/2023 w/ concern for aspiration occurring during the study, which prompted this evaluation today.   Pt denied any difficulty swallowing at meals; and No episodes of choking/coughing at home.  He also denied any Pulmonary decline(does not remember having a CXR) or weight loss in last 6 months.   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 Pt also has a Baseline of Esophageal phase dysmotility and REFLUX; he is on a PPI 1x daily per his report. No aspiration occurred during this study today.   Oral stage is characterized by adequate lip closure, bolus preparation and containment, and anterior to posterior transit. Intermittent premature spillage w/ thin liquids occurred - moreso w/ TSP trial. Swallow initiation occurs at the level of the BOT-valleculae w/ exception of posteior laryngeal surface of epiglottis for swallow  initiation w/ thin liquids. Shallow laryngeal penetration occured x1 with thin liquids-TSP trial (trace coating along the underside of the epiglottis); No penetration occurred with any other consistency. NO aspiration occurred w/ any consistency.  Pharyngeal stage is noted for mildly reduced tongue base retraction w/ INCREASED TEXTURED FOODS (likely age-related) leading to min-mod residue collection in the valleculae along w/ slight-min residue in the pyriform sinuses and the aeryepiglottic folds. ALSO NOTED was adjacent curvature of the cervical spine(Kyphosis appearance) which may have impacted bolus clearance through the pharynx. Noted adequate hyolaryngeal excursion, and adequate pharyngeal constriction. Epiglottic deflection is complete; there is no consistent penetration and NO aspiration of any trial consistency during this study. The valleculae and min surrounding residue on pharyngeal structures, primarily w/ thicker and solid consistencies, is reduced w/ a dry swallow and when combined with chin tuck maneuver, the residue clearance improved significantly, with only a slight coating of residue remaining in the valleculae. Unsure if contributing to the decreased pharyngeal clearance(residue) is the presence of prominence in the CP Segment, along w/ the curvature of the cervical spine appearance. Pharyngeal stripping wave is fairly complete.  Amplitude/duration of cricopharyngeus opening is WFL. There is adequate clearance of boluses through the cervical esophagus. Anterior/posterior prominence in the CP Segment noted. An esophageal sweep w/ barium tablet in Puree was performed in the lateral, sitting position which was unremarkable.    Consistencies tested: thin liquids x2 tsps, 1 cup sip, 3 sequential sips, nectar x1 tsp, 1 cup sip, 2 sequential cup sips, honey x1 tsp, pudding x1 tsp, regular solid (1/2 graham cracker with pudding), and 13 mm barium tablet with puree.  Recommend patient continue a  fairly regular diet with thin liquids; educated pt verbally and w/ model/practice re: strategies including moistening dry meats, small bites/sips, alternating solids and liquids, swallowing twice - Dry swallow w/ a chin tuck intermittently during the meal. Pt demonstrated teach-back of the Dry swallow w/ chin tuck strategies and had no further questions. No further ST indicated. Factors that may increase risk of adverse event in presence of aspiration Rubye [place] & Clearance Coots 2021):  (n/a)  Swallow Evaluation Recommendations Recommendations: PO diet PO Diet Recommendation: Regular;Thin liquids (Level 0) (small cut, moistened foods; single sips) Liquid Administration via: Cup Medication Administration: Whole meds with puree (for ease and safety of swallowing and clearing the Esophagus) Supervision: Patient able to self-feed Swallowing strategies  : Minimize environmental distractions;Slow rate;Small bites/sips;Multiple dry swallows after each bite/sip;Follow solids with liquids;Chin tuck Postural changes: Position pt fully upright for meals;Stay upright 30-60 min after meals Oral care recommendations: Oral care BID (2x/day);Pt independent with oral care Recommended consults: Consider ENT consultation;Consider GI consultation (to further assess prominent CP Segment; REFLUX s/s and management); also Dietician f/u for nutrition support as pt described not eating full meal at times and working out/exercising. He was interested in a low-sugar supplement drink.        Jerilynn Som, MS, CCC-SLP Speech Language Pathologist Rehab Services; [facility] [phone] (ascom) , 05/24/2023,5:02 PM

## 2023-06-23 DIAGNOSIS — E119 Type 2 diabetes mellitus without complications: Secondary | ICD-10-CM | POA: Diagnosis not present

## 2023-06-29 DIAGNOSIS — E1142 Type 2 diabetes mellitus with diabetic polyneuropathy: Secondary | ICD-10-CM | POA: Diagnosis not present

## 2023-06-29 DIAGNOSIS — Z8673 Personal history of transient ischemic attack (TIA), and cerebral infarction without residual deficits: Secondary | ICD-10-CM | POA: Diagnosis not present

## 2023-06-29 DIAGNOSIS — R29898 Other symptoms and signs involving the musculoskeletal system: Secondary | ICD-10-CM | POA: Diagnosis not present

## 2023-06-29 DIAGNOSIS — G5602 Carpal tunnel syndrome, left upper limb: Secondary | ICD-10-CM | POA: Diagnosis not present

## 2023-06-29 DIAGNOSIS — J3489 Other specified disorders of nose and nasal sinuses: Secondary | ICD-10-CM | POA: Diagnosis not present

## 2023-06-29 DIAGNOSIS — R42 Dizziness and giddiness: Secondary | ICD-10-CM | POA: Diagnosis not present

## 2023-07-18 DIAGNOSIS — H04123 Dry eye syndrome of bilateral lacrimal glands: Secondary | ICD-10-CM | POA: Diagnosis not present

## 2023-07-18 DIAGNOSIS — H43813 Vitreous degeneration, bilateral: Secondary | ICD-10-CM | POA: Diagnosis not present

## 2023-07-18 DIAGNOSIS — E113293 Type 2 diabetes mellitus with mild nonproliferative diabetic retinopathy without macular edema, bilateral: Secondary | ICD-10-CM | POA: Diagnosis not present

## 2023-07-18 DIAGNOSIS — Z961 Presence of intraocular lens: Secondary | ICD-10-CM | POA: Diagnosis not present

## 2023-07-20 DIAGNOSIS — E1140 Type 2 diabetes mellitus with diabetic neuropathy, unspecified: Secondary | ICD-10-CM | POA: Diagnosis not present

## 2023-07-20 DIAGNOSIS — Z794 Long term (current) use of insulin: Secondary | ICD-10-CM | POA: Diagnosis not present

## 2023-07-20 DIAGNOSIS — E1159 Type 2 diabetes mellitus with other circulatory complications: Secondary | ICD-10-CM | POA: Diagnosis not present

## 2023-07-20 DIAGNOSIS — E7800 Pure hypercholesterolemia, unspecified: Secondary | ICD-10-CM | POA: Diagnosis not present

## 2023-07-20 DIAGNOSIS — I152 Hypertension secondary to endocrine disorders: Secondary | ICD-10-CM | POA: Diagnosis not present

## 2023-07-23 DIAGNOSIS — E119 Type 2 diabetes mellitus without complications: Secondary | ICD-10-CM | POA: Diagnosis not present

## 2023-07-25 DIAGNOSIS — E119 Type 2 diabetes mellitus without complications: Secondary | ICD-10-CM | POA: Diagnosis not present

## 2023-07-25 DIAGNOSIS — Z794 Long term (current) use of insulin: Secondary | ICD-10-CM | POA: Diagnosis not present

## 2023-07-25 DIAGNOSIS — E1169 Type 2 diabetes mellitus with other specified complication: Secondary | ICD-10-CM | POA: Diagnosis not present

## 2023-07-25 DIAGNOSIS — I152 Hypertension secondary to endocrine disorders: Secondary | ICD-10-CM | POA: Diagnosis not present

## 2023-07-25 DIAGNOSIS — E1159 Type 2 diabetes mellitus with other circulatory complications: Secondary | ICD-10-CM | POA: Diagnosis not present

## 2023-07-25 DIAGNOSIS — E785 Hyperlipidemia, unspecified: Secondary | ICD-10-CM | POA: Diagnosis not present

## 2023-08-21 ENCOUNTER — Other Ambulatory Visit: Payer: Medicare PPO

## 2023-08-21 DIAGNOSIS — N401 Enlarged prostate with lower urinary tract symptoms: Secondary | ICD-10-CM | POA: Diagnosis not present

## 2023-08-21 DIAGNOSIS — Z0000 Encounter for general adult medical examination without abnormal findings: Secondary | ICD-10-CM | POA: Diagnosis not present

## 2023-08-21 DIAGNOSIS — D472 Monoclonal gammopathy: Secondary | ICD-10-CM | POA: Diagnosis not present

## 2023-08-21 DIAGNOSIS — Z1331 Encounter for screening for depression: Secondary | ICD-10-CM | POA: Diagnosis not present

## 2023-08-21 DIAGNOSIS — E7800 Pure hypercholesterolemia, unspecified: Secondary | ICD-10-CM | POA: Diagnosis not present

## 2023-08-21 DIAGNOSIS — E1159 Type 2 diabetes mellitus with other circulatory complications: Secondary | ICD-10-CM | POA: Diagnosis not present

## 2023-08-21 DIAGNOSIS — E1140 Type 2 diabetes mellitus with diabetic neuropathy, unspecified: Secondary | ICD-10-CM | POA: Diagnosis not present

## 2023-08-21 DIAGNOSIS — K219 Gastro-esophageal reflux disease without esophagitis: Secondary | ICD-10-CM | POA: Diagnosis not present

## 2023-08-21 DIAGNOSIS — E1122 Type 2 diabetes mellitus with diabetic chronic kidney disease: Secondary | ICD-10-CM | POA: Diagnosis not present

## 2023-08-21 DIAGNOSIS — M159 Polyosteoarthritis, unspecified: Secondary | ICD-10-CM | POA: Diagnosis not present

## 2023-08-22 ENCOUNTER — Inpatient Hospital Stay: Payer: Medicare PPO | Attending: Oncology

## 2023-08-22 DIAGNOSIS — D472 Monoclonal gammopathy: Secondary | ICD-10-CM | POA: Insufficient documentation

## 2023-08-22 DIAGNOSIS — E1142 Type 2 diabetes mellitus with diabetic polyneuropathy: Secondary | ICD-10-CM | POA: Insufficient documentation

## 2023-08-22 LAB — CBC WITH DIFFERENTIAL (CANCER CENTER ONLY)
Abs Immature Granulocytes: 0.02 10*3/uL (ref 0.00–0.07)
Basophils Absolute: 0 10*3/uL (ref 0.0–0.1)
Basophils Relative: 1 %
Eosinophils Absolute: 0.1 10*3/uL (ref 0.0–0.5)
Eosinophils Relative: 1 %
HCT: 42.4 % (ref 39.0–52.0)
Hemoglobin: 14.1 g/dL (ref 13.0–17.0)
Immature Granulocytes: 0 %
Lymphocytes Relative: 26 %
Lymphs Abs: 1.7 10*3/uL (ref 0.7–4.0)
MCH: 30.3 pg (ref 26.0–34.0)
MCHC: 33.3 g/dL (ref 30.0–36.0)
MCV: 91.2 fL (ref 80.0–100.0)
Monocytes Absolute: 0.3 10*3/uL (ref 0.1–1.0)
Monocytes Relative: 5 %
Neutro Abs: 4.3 10*3/uL (ref 1.7–7.7)
Neutrophils Relative %: 67 %
Platelet Count: 211 10*3/uL (ref 150–400)
RBC: 4.65 MIL/uL (ref 4.22–5.81)
RDW: 12.8 % (ref 11.5–15.5)
WBC Count: 6.5 10*3/uL (ref 4.0–10.5)
nRBC: 0 % (ref 0.0–0.2)

## 2023-08-22 LAB — CMP (CANCER CENTER ONLY)
ALT: 32 U/L (ref 0–44)
AST: 32 U/L (ref 15–41)
Albumin: 4.4 g/dL (ref 3.5–5.0)
Alkaline Phosphatase: 53 U/L (ref 38–126)
Anion gap: 9 (ref 5–15)
BUN: 23 mg/dL (ref 8–23)
CO2: 23 mmol/L (ref 22–32)
Calcium: 9.3 mg/dL (ref 8.9–10.3)
Chloride: 103 mmol/L (ref 98–111)
Creatinine: 1.09 mg/dL (ref 0.61–1.24)
GFR, Estimated: >60 mL/min (ref >60)
Glucose, Bld: 144 mg/dL — ABNORMAL HIGH (ref 70–99)
Potassium: 4.2 mmol/L (ref 3.5–5.1)
Sodium: 135 mmol/L (ref 135–145)
Total Bilirubin: 0.7 mg/dL (ref <1.2)
Total Protein: 7.7 g/dL (ref 6.5–8.1)

## 2023-08-23 DIAGNOSIS — E119 Type 2 diabetes mellitus without complications: Secondary | ICD-10-CM | POA: Diagnosis not present

## 2023-08-23 LAB — KAPPA/LAMBDA LIGHT CHAINS
Kappa free light chain: 512.1 mg/L — ABNORMAL HIGH (ref 3.3–19.4)
Kappa, lambda light chain ratio: 13.66 — ABNORMAL HIGH (ref 0.26–1.65)
Lambda free light chains: 37.5 mg/L — ABNORMAL HIGH (ref 5.7–26.3)

## 2023-08-27 LAB — MULTIPLE MYELOMA PANEL, SERUM
Albumin SerPl Elph-Mcnc: 3.9 g/dL (ref 2.9–4.4)
Albumin/Glob SerPl: 1.4 (ref 0.7–1.7)
Alpha 1: 0.2 g/dL (ref 0.0–0.4)
Alpha2 Glob SerPl Elph-Mcnc: 0.7 g/dL (ref 0.4–1.0)
B-Globulin SerPl Elph-Mcnc: 0.8 g/dL (ref 0.7–1.3)
Gamma Glob SerPl Elph-Mcnc: 1.1 g/dL (ref 0.4–1.8)
Globulin, Total: 2.8 g/dL (ref 2.2–3.9)
IgA: 162 mg/dL (ref 61–437)
IgG (Immunoglobin G), Serum: 1213 mg/dL (ref 603–1613)
IgM (Immunoglobulin M), Srm: 81 mg/dL (ref 15–143)
M Protein SerPl Elph-Mcnc: 0.7 g/dL — ABNORMAL HIGH
Total Protein ELP: 6.7 g/dL (ref 6.0–8.5)

## 2023-08-29 ENCOUNTER — Ambulatory Visit: Payer: Medicare PPO | Admitting: Oncology

## 2023-08-30 ENCOUNTER — Encounter: Payer: Self-pay | Admitting: Oncology

## 2023-08-30 ENCOUNTER — Inpatient Hospital Stay: Payer: Medicare PPO | Admitting: Oncology

## 2023-08-30 VITALS — BP 131/74 | HR 64 | Temp 95.0°F | Resp 18 | Wt 140.2 lb

## 2023-08-30 DIAGNOSIS — D472 Monoclonal gammopathy: Secondary | ICD-10-CM | POA: Diagnosis not present

## 2023-08-30 DIAGNOSIS — E1142 Type 2 diabetes mellitus with diabetic polyneuropathy: Secondary | ICD-10-CM | POA: Diagnosis not present

## 2023-08-30 NOTE — Progress Notes (Signed)
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 Lab Results  Component Value Date   MPROTEIN 0.7 (H) 08/22/2023   KPAFRELGTCHN 512.1 (H) 08/22/2023   LAMBDASER 37.5 (H) 08/22/2023   KAPLAMBRATIO 13.66 (H) 08/22/2023        Slow progressing SPEP and UPEP M protein and light chain chatio.  He has normal hemoglobin and normal kidney function.  Continue observation.  January 2024 negative skeletal survey.    Orders Placed This Encounter  Procedures   CBC with Differential (Cancer Center Only)    Standing Status:   Future    Standing Expiration Date:   08/29/2024   CMP (Cancer Center only)    Standing Status:   Future    Standing Expiration Date:   08/29/2024   Multiple Myeloma Panel (SPEP&IFE w/QIG)    Standing Status:   Future    Standing Expiration Date:   08/29/2024   Kappa/lambda light chains    Standing Status:   Future    Standing Expiration Date:   08/29/2024   IFE+PROTEIN ELECTRO, 24-HR UR    Standing Status:   Future    Standing Expiration Date:   08/29/2024   Follow up in 6 months.   All questions were answered. The patient knows to call the clinic with any problems, questions or concerns.  Rickard Patience, MD, PhD [facility] Hematology Oncology 08/30/2023    CHIEF COMPLAINTS/REASON FOR VISIT:  Follow up for MGUS  HISTORY OF PRESENTING ILLNESS:   Terry Vazquez is a  82 y.o.  male presents for follow up of MGUS.   Patient recently was seen by neurology for left leg weakness, back pain, diabetic polyneuropathy.  He had work up done.  08/20/2021 SPEP showed M spike of 0.5g/dl, IgG kappa.   09/14/2021 Free kappa light chain 132.9, lamda light chain 36.9, kappa/lamda ratio 3.6 He was referred to hematology for further evaluation.    INTERVAL HISTORY Patient feels well today. No new complaints.     Review of Systems  Constitutional:  Negative for appetite change, chills, fatigue, fever and unexpected weight change.  HENT:   Negative for hearing loss and voice change.   Eyes:  Negative for eye problems and icterus.  Respiratory:  Negative for chest tightness, cough and shortness of breath.   Cardiovascular:  Negative for chest pain and leg swelling.  Gastrointestinal:  Negative for abdominal distention and abdominal pain.  Endocrine: Negative for hot flashes.  Genitourinary:  Negative for difficulty urinating, dysuria and frequency.   Musculoskeletal:  Negative for arthralgias.  Skin:  Negative for itching and rash.  Neurological:  Positive for extremity weakness. Negative for light-headedness and numbness.  Hematological:  Negative for adenopathy. Does not bruise/bleed easily.  Psychiatric/Behavioral:  Negative for confusion.     MEDICAL HISTORY:  Past Medical History:  Diagnosis Date   Cataract extraction status of right eye    CKD (chronic kidney disease)    Diabetes mellitus without complication (HCC)    Hyperlipidemia    Hypertension    Hypertension associated with diabetes (HCC)    Migraine without status migrainosus, not intractable    Other seasonal allergic rhinitis  Puckering of macula    Vitreous degeneration of left eye     SURGICAL HISTORY: Past Surgical History:  Procedure Laterality Date   CATARACT EXTRACTION, BILATERAL     Left Ring and Long Trigger Finger Release     TONSILLECTOMY     TONSILLECTOMY AND ADENOIDECTOMY     TOOTH EXTRACTION      SOCIAL HISTORY: Social History   Socioeconomic History   Marital status: Married    Spouse name: Not on file   Number of children: Not on file   Years of education: Not on file   Highest  education level: Not on file  Occupational History   Not on file  Tobacco Use   Smoking status: Never   Smokeless tobacco: Never  Vaping Use   Vaping status: Never Used  Substance and Sexual Activity   Alcohol use: Never   Drug use: Never   Sexual activity: Not on file  Other Topics Concern   Not on file  Social History Narrative   Not on file   Social Determinants of Health   Financial Resource Strain: Low Risk  (08/21/2023)   Received [facility]   Overall Financial Resource Strain (CARDIA)    Difficulty of Paying Living Expenses: Not hard at all  Food Insecurity: No Food Insecurity (08/21/2023)   Received from [facility]   Hunger Vital Sign    Worried About Running Out of Food in the Last Year: Never true    Ran Out of Food in the Last Year: Never true  Transportation Needs: No Transportation Needs (08/21/2023)   Received from [facility] - Transportation    In the past 12 months, has lack of transportation kept you from medical appointments or from getting medications?: No    Lack of Transportation (Non-Medical): No  Physical Activity: Not on file  Stress: Not on file  Social Connections: Not on file  Intimate Partner Violence: Not on file    FAMILY HISTORY: Family History  Problem Relation Age of Onset   Diabetes Mother    Parkinson's disease Mother    Heart failure Father    Kidney disease Father    Rheum arthritis Sister    Diabetes Brother    Heart failure Brother    Arthritis Brother    Diabetes Other        multiple cousins with diabetes    ALLERGIES:  has No Known Allergies.  MEDICATIONS:  Current Outpatient Medications  Medication Sig Dispense Refill   aspirin EC 81 MG tablet Take 81 mg by mouth daily.      atorvastatin (LIPITOR) 20 MG tablet Take 20 mg by mouth daily.      benazepril (LOTENSIN) 20 MG tablet Take 20 mg by mouth daily.      Calcium Carbonate-Vit D-Min  (CALCIUM 1200 PO) Take 1 tablet by mouth 3 (three) times a week.     cyanocobalamin (VITAMIN B12) 1000 MCG tablet Take 1,000 mcg by mouth daily.     Dulaglutide 0.75 MG/0.5ML SOPN Inject 0.75 mg into the skin once a week.      famotidine (PEPCID) 20 MG tablet Take 20 mg by mouth daily.     glucosamine-chondroitin 500-400 MG tablet Take 1 tablet by mouth daily.      Insulin Degludec (TRESIBA FLEXTOUCH [place]) Inject 13 Units into the skin at bedtime.     Loratadine 10 MG CAPS Take 10 mg by mouth daily.  metFORMIN (GLUCOPHAGE-XR) 500 MG 24 hr tablet Take 500 mg by mouth 2 (two) times daily.     omeprazole (PRILOSEC) 20 MG capsule Take by mouth.     OVER THE COUNTER MEDICATION NATURAL BLACK CHERRY CONCENTRATE     repaglinide (PRANDIN) 1 MG tablet Take 1 tablet by mouth 3 (three) times daily before meals.     No current facility-administered medications for this visit.     PHYSICAL EXAMINATION: ECOG PERFORMANCE STATUS: 1 - Symptomatic but completely ambulatory Vitals:   08/30/23 1046  BP: 131/74  Pulse: 64  Resp: 18  Temp: (!) 95 F (35 C)  SpO2: 100%   Filed Weights   08/30/23 1046  Weight: 140 lb 3.2 oz (63.6 kg)    Physical Exam Constitutional:      General: He is not in acute distress. HENT:     Head: Normocephalic and atraumatic.  Eyes:     General: No scleral icterus. Cardiovascular:     Rate and Rhythm: Normal rate.  Pulmonary:     Effort: Pulmonary effort is normal. No respiratory distress.  Abdominal:     General: There is no distension.  Musculoskeletal:        General: No deformity. Normal range of motion.     Cervical back: Normal range of motion and neck supple.  Skin:    Findings: No rash.  Neurological:     Mental Status: He is alert and oriented to person, place, and time. Mental status is at baseline.  Psychiatric:        Mood and Affect: Mood normal.     LABORATORY DATA:  I have reviewed the data as listed    Latest Ref Rng & Units 08/22/2023    10:22 AM 04/18/2023   12:42 PM 10/07/2022    1:37 PM  CBC  WBC 4.0 - 10.5 K/uL 6.5  5.8  6.5   Hemoglobin 13.0 - 17.0 g/dL 82.9  56.2  13.0   Hematocrit 39.0 - 52.0 % 42.4  36.1  38.5   Platelets 150 - 400 K/uL 211  181  214       Latest Ref Rng & Units 08/22/2023   10:23 AM 04/18/2023   12:42 PM 10/07/2022    1:37 PM  CMP  Glucose 70 - 99 mg/dL 865  784  696   BUN 8 - 23 mg/dL 23  25  23    Creatinine 0.61 - 1.24 mg/dL 2.95  2.84  1.32   Sodium 135 - 145 mmol/L 135  131  137   Potassium 3.5 - 5.1 mmol/L 4.2  4.1  4.1   Chloride 98 - 111 mmol/L 103  102  106   CO2 22 - 32 mmol/L 23  20  24    Calcium 8.9 - 10.3 mg/dL 9.3  8.7  9.1   Total Protein 6.5 - 8.1 g/dL 7.7  6.7  7.2   Total Bilirubin <1.2 mg/dL 0.7  0.5  0.6   Alkaline Phos 38 - 126 U/L 53  46  56   AST 15 - 41 U/L 32  25  28   ALT 0 - 44 U/L 32  18  19      RADIOGRAPHIC STUDIES: I have personally reviewed the radiological images as listed and agreed with the findings in the report. No results found.

## 2023-08-30 NOTE — Assessment & Plan Note (Addendum)
IgG kappa MGUS Labs are reviewed and discussed with patient. Lab Results  Component Value Date   MPROTEIN 0.7 (H) 08/22/2023   KPAFRELGTCHN 512.1 (H) 08/22/2023   LAMBDASER 37.5 (H) 08/22/2023   KAPLAMBRATIO 13.66 (H) 08/22/2023        Slow progressing SPEP and UPEP M protein and light chain chatio.  He has normal hemoglobin and normal kidney function.  Continue observation.  January 2024 negative skeletal survey.

## 2023-09-22 DIAGNOSIS — E119 Type 2 diabetes mellitus without complications: Secondary | ICD-10-CM | POA: Diagnosis not present

## 2023-10-23 DIAGNOSIS — E119 Type 2 diabetes mellitus without complications: Secondary | ICD-10-CM | POA: Diagnosis not present

## 2023-11-23 DIAGNOSIS — E119 Type 2 diabetes mellitus without complications: Secondary | ICD-10-CM | POA: Diagnosis not present

## 2023-12-21 DIAGNOSIS — Z03818 Encounter for observation for suspected exposure to other biological agents ruled out: Secondary | ICD-10-CM | POA: Diagnosis not present

## 2023-12-21 DIAGNOSIS — R42 Dizziness and giddiness: Secondary | ICD-10-CM | POA: Diagnosis not present

## 2023-12-21 DIAGNOSIS — E119 Type 2 diabetes mellitus without complications: Secondary | ICD-10-CM | POA: Diagnosis not present

## 2024-01-21 DIAGNOSIS — E119 Type 2 diabetes mellitus without complications: Secondary | ICD-10-CM | POA: Diagnosis not present

## 2024-01-31 DIAGNOSIS — E785 Hyperlipidemia, unspecified: Secondary | ICD-10-CM | POA: Diagnosis not present

## 2024-01-31 DIAGNOSIS — I152 Hypertension secondary to endocrine disorders: Secondary | ICD-10-CM | POA: Diagnosis not present

## 2024-01-31 DIAGNOSIS — E1169 Type 2 diabetes mellitus with other specified complication: Secondary | ICD-10-CM | POA: Diagnosis not present

## 2024-01-31 DIAGNOSIS — E119 Type 2 diabetes mellitus without complications: Secondary | ICD-10-CM | POA: Diagnosis not present

## 2024-01-31 DIAGNOSIS — Z794 Long term (current) use of insulin: Secondary | ICD-10-CM | POA: Diagnosis not present

## 2024-01-31 DIAGNOSIS — E1159 Type 2 diabetes mellitus with other circulatory complications: Secondary | ICD-10-CM | POA: Diagnosis not present

## 2024-02-13 DIAGNOSIS — E7800 Pure hypercholesterolemia, unspecified: Secondary | ICD-10-CM | POA: Diagnosis not present

## 2024-02-20 DIAGNOSIS — E1140 Type 2 diabetes mellitus with diabetic neuropathy, unspecified: Secondary | ICD-10-CM | POA: Diagnosis not present

## 2024-02-20 DIAGNOSIS — M130 Polyarthritis, unspecified: Secondary | ICD-10-CM | POA: Diagnosis not present

## 2024-02-20 DIAGNOSIS — E1159 Type 2 diabetes mellitus with other circulatory complications: Secondary | ICD-10-CM | POA: Diagnosis not present

## 2024-02-20 DIAGNOSIS — E119 Type 2 diabetes mellitus without complications: Secondary | ICD-10-CM | POA: Diagnosis not present

## 2024-02-20 DIAGNOSIS — E1122 Type 2 diabetes mellitus with diabetic chronic kidney disease: Secondary | ICD-10-CM | POA: Diagnosis not present

## 2024-02-20 DIAGNOSIS — E7800 Pure hypercholesterolemia, unspecified: Secondary | ICD-10-CM | POA: Diagnosis not present

## 2024-02-20 DIAGNOSIS — D472 Monoclonal gammopathy: Secondary | ICD-10-CM | POA: Diagnosis not present

## 2024-02-20 DIAGNOSIS — N401 Enlarged prostate with lower urinary tract symptoms: Secondary | ICD-10-CM | POA: Diagnosis not present

## 2024-02-20 DIAGNOSIS — K219 Gastro-esophageal reflux disease without esophagitis: Secondary | ICD-10-CM | POA: Diagnosis not present

## 2024-02-20 DIAGNOSIS — M79662 Pain in left lower leg: Secondary | ICD-10-CM | POA: Diagnosis not present

## 2024-02-27 ENCOUNTER — Inpatient Hospital Stay: Payer: Medicare PPO | Attending: Oncology

## 2024-02-27 DIAGNOSIS — D472 Monoclonal gammopathy: Secondary | ICD-10-CM | POA: Insufficient documentation

## 2024-02-27 LAB — CMP (CANCER CENTER ONLY)
ALT: 23 U/L (ref 0–44)
AST: 27 U/L (ref 15–41)
Albumin: 3.7 g/dL (ref 3.5–5.0)
Alkaline Phosphatase: 51 U/L (ref 38–126)
Anion gap: 8 (ref 5–15)
BUN: 25 mg/dL — ABNORMAL HIGH (ref 8–23)
CO2: 22 mmol/L (ref 22–32)
Calcium: 8.6 mg/dL — ABNORMAL LOW (ref 8.9–10.3)
Chloride: 100 mmol/L (ref 98–111)
Creatinine: 1.22 mg/dL (ref 0.61–1.24)
GFR, Estimated: 59 mL/min — ABNORMAL LOW (ref >60)
Glucose, Bld: 162 mg/dL — ABNORMAL HIGH (ref 70–99)
Potassium: 4.3 mmol/L (ref 3.5–5.1)
Sodium: 130 mmol/L — ABNORMAL LOW (ref 135–145)
Total Bilirubin: 0.7 mg/dL (ref 0.0–1.2)
Total Protein: 6.8 g/dL (ref 6.5–8.1)

## 2024-02-27 LAB — CBC WITH DIFFERENTIAL (CANCER CENTER ONLY)
Abs Immature Granulocytes: 0.02 10*3/uL (ref 0.00–0.07)
Basophils Absolute: 0 10*3/uL (ref 0.0–0.1)
Basophils Relative: 1 %
Eosinophils Absolute: 0.1 10*3/uL (ref 0.0–0.5)
Eosinophils Relative: 1 %
HCT: 36 % — ABNORMAL LOW (ref 39.0–52.0)
Hemoglobin: 12.2 g/dL — ABNORMAL LOW (ref 13.0–17.0)
Immature Granulocytes: 0 %
Lymphocytes Relative: 22 %
Lymphs Abs: 1.3 10*3/uL (ref 0.7–4.0)
MCH: 30.7 pg (ref 26.0–34.0)
MCHC: 33.9 g/dL (ref 30.0–36.0)
MCV: 90.5 fL (ref 80.0–100.0)
Monocytes Absolute: 0.4 10*3/uL (ref 0.1–1.0)
Monocytes Relative: 6 %
Neutro Abs: 4.4 10*3/uL (ref 1.7–7.7)
Neutrophils Relative %: 70 %
Platelet Count: 186 10*3/uL (ref 150–400)
RBC: 3.98 MIL/uL — ABNORMAL LOW (ref 4.22–5.81)
RDW: 12.6 % (ref 11.5–15.5)
WBC Count: 6.2 10*3/uL (ref 4.0–10.5)
nRBC: 0 % (ref 0.0–0.2)

## 2024-02-28 LAB — KAPPA/LAMBDA LIGHT CHAINS
Kappa free light chain: 437.6 mg/L — ABNORMAL HIGH (ref 3.3–19.4)
Kappa, lambda light chain ratio: 14.07 — ABNORMAL HIGH (ref 0.26–1.65)
Lambda free light chains: 31.1 mg/L — ABNORMAL HIGH (ref 5.7–26.3)

## 2024-02-29 LAB — MULTIPLE MYELOMA PANEL, SERUM
Albumin SerPl Elph-Mcnc: 3.4 g/dL (ref 2.9–4.4)
Albumin/Glob SerPl: 1.4 (ref 0.7–1.7)
Alpha 1: 0.2 g/dL (ref 0.0–0.4)
Alpha2 Glob SerPl Elph-Mcnc: 0.7 g/dL (ref 0.4–1.0)
B-Globulin SerPl Elph-Mcnc: 0.8 g/dL (ref 0.7–1.3)
Gamma Glob SerPl Elph-Mcnc: 1 g/dL (ref 0.4–1.8)
Globulin, Total: 2.6 g/dL (ref 2.2–3.9)
IgA: 122 mg/dL (ref 61–437)
IgG (Immunoglobin G), Serum: 1090 mg/dL (ref 603–1613)
IgM (Immunoglobulin M), Srm: 66 mg/dL (ref 15–143)
M Protein SerPl Elph-Mcnc: 0.6 g/dL — ABNORMAL HIGH
Total Protein ELP: 6 g/dL (ref 6.0–8.5)

## 2024-03-01 LAB — IFE+PROTEIN ELECTRO, 24-HR UR
% BETA, Urine: 85.4 %
ALPHA 1 URINE: 0.8 %
Albumin, U: 3.7 %
Alpha 2, Urine: 3.7 %
GAMMA GLOBULIN URINE: 6.5 %
M-SPIKE %, Urine: 79.3 % — ABNORMAL HIGH
M-Spike, Mg/24 Hr: 319 mg/(24.h) — ABNORMAL HIGH
Total Protein, Urine-Ur/day: 402 mg/(24.h) — ABNORMAL HIGH (ref 30–150)
Total Protein, Urine: 32.8 mg/dL
Total Volume: 1225

## 2024-03-07 ENCOUNTER — Inpatient Hospital Stay: Payer: Medicare PPO | Admitting: Oncology

## 2024-03-07 ENCOUNTER — Encounter: Payer: Self-pay | Admitting: Oncology

## 2024-03-07 VITALS — BP 134/65 | HR 64 | Temp 97.5°F | Resp 15 | Wt 137.0 lb

## 2024-03-07 DIAGNOSIS — D472 Monoclonal gammopathy: Secondary | ICD-10-CM

## 2024-03-07 NOTE — Progress Notes (Signed)
 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 Lab Results  Component Value Date   MPROTEIN 0.6 (H) 02/27/2024   KPAFRELGTCHN 437.6 (H) 02/27/2024   LAMBDASER 31.1 (H) 02/27/2024   KAPLAMBRATIO 14.07 (H) 02/27/2024      Slow progressing SPEP and UPEP M protein and light chain chatio.  He has normal hemoglobin and normal kidney function.   January 2024 negative skeletal survey.  Continue observation.    Orders Placed This Encounter  Procedures   DG Bone Survey Met    Standing Status:   Future    Expected Date:   09/07/2024    Expiration Date:   03/08/2025    Reason for Exam (SYMPTOM  OR DIAGNOSIS REQUIRED):   MGUS    Preferred imaging location?:   [facility]   CBC with Differential (Cancer Center Only)    Standing Status:   Future    Expected Date:   09/07/2024    Expiration Date:   03/07/2025   CMP (Cancer Center only)    Standing Status:   Future    Expected Date:   09/07/2024    Expiration Date:   03/07/2025   Kappa/lambda light chains    Standing Status:   Future    Expected Date:   09/07/2024    Expiration Date:   03/07/2025   Multiple Myeloma Panel (SPEP&IFE w/QIG)    Standing Status:   Future    Expected Date:   09/07/2024    Expiration Date:   03/07/2025   IFE+PROTEIN ELECTRO, 24-HR UR    Standing Status:   Future    Expected Date:   09/07/2024    Expiration Date:   03/07/2025   Follow up in 6 months.   All questions were answered. The patient knows to call the clinic with any problems, questions or concerns.  Timmy Forbes, MD, PhD [facility] Hematology Oncology 03/07/2024     CHIEF COMPLAINTS/REASON FOR VISIT:  Follow up for MGUS  HISTORY OF PRESENTING ILLNESS:   Terry Vazquez is a  83 y.o.  male presents for follow up of MGUS.   Patient recently was seen by neurology for left leg weakness, back pain, diabetic polyneuropathy.  He had work up done.  08/20/2021 SPEP showed M spike of 0.5g/dl, IgG kappa.  09/14/2021 Free kappa light chain 132.9, lamda light chain 36.9, kappa/lamda ratio 3.6 He was referred to hematology for further evaluation.    INTERVAL HISTORY Patient feels well today. No new complaints.     Review of Systems  Constitutional:  Negative for appetite change, chills, fatigue, fever and unexpected weight change.  HENT:   Negative for hearing loss and voice change.   Eyes:  Negative for eye problems and icterus.  Respiratory:  Negative for chest tightness, cough and shortness of breath.   Cardiovascular:  Negative for chest pain and leg swelling.  Gastrointestinal:  Negative for abdominal distention and abdominal pain.  Endocrine: Negative for hot flashes.  Genitourinary:  Negative for difficulty urinating, dysuria and frequency.   Musculoskeletal:  Negative for arthralgias.  Skin:  Negative for itching and rash.  Neurological:  Positive for extremity weakness. Negative for light-headedness  and numbness.  Hematological:  Negative for adenopathy. Does not bruise/bleed easily.  Psychiatric/Behavioral:  Negative for confusion.     MEDICAL HISTORY:  Past Medical History:  Diagnosis Date   Cataract extraction status of right eye    CKD (chronic kidney disease)    Diabetes mellitus without complication (HCC)    Hyperlipidemia    Hypertension    Hypertension associated with diabetes (HCC)    Migraine without status migrainosus, not intractable    Other seasonal allergic rhinitis    Puckering of macula    Vitreous degeneration of left eye     SURGICAL HISTORY: Past Surgical History:  Procedure Laterality Date   CATARACT  EXTRACTION, BILATERAL     Left Ring and Long Trigger Finger Release     TONSILLECTOMY     TONSILLECTOMY AND ADENOIDECTOMY     TOOTH EXTRACTION      SOCIAL HISTORY: Social History   Socioeconomic History   Marital status: Married    Spouse name: Not on file   Number of children: Not on file   Years of education: Not on file   Highest education level: Not on file  Occupational History   Not on file  Tobacco Use   Smoking status: Never   Smokeless tobacco: Never  Vaping Use   Vaping status: Never Used  Substance and Sexual Activity   Alcohol use: Never   Drug use: Never   Sexual activity: Not on file  Other Topics Concern   Not on file  Social History Narrative   Not on file   Social Drivers of Health   Financial Resource Strain: Low Risk  (08/21/2023)   Received from [facility]   Overall Financial Resource Strain (CARDIA)    Difficulty of Paying Living Expenses: Not hard at all  Food Insecurity: No Food Insecurity (08/21/2023)   Received from [facility]   Hunger Vital Sign    Worried About Running Out of Food in the Last Year: Never true    Ran Out of Food in the Last Year: Never true  Transportation Needs: No Transportation Needs (08/21/2023)   Received from [facility] - Transportation    In the past 12 months, has lack of transportation kept you from medical appointments or from getting medications?: No    Lack of Transportation (Non-Medical): No  Physical Activity: Not on file  Stress: Not on file  Social Connections: Not on file  Intimate Partner Violence: Not on file    FAMILY HISTORY: Family History  Problem Relation Age of Onset   Diabetes Mother    Parkinson's disease Mother    Heart failure Father    Kidney disease Father    Rheum arthritis Sister    Diabetes Brother    Heart failure Brother    Arthritis Brother    Diabetes Other        multiple cousins with diabetes     ALLERGIES:  has no known allergies.  MEDICATIONS:  Current Outpatient Medications  Medication Sig Dispense Refill   aspirin EC 81 MG tablet Take 81 mg by mouth daily.      atorvastatin (LIPITOR) 20 MG tablet Take 20 mg by mouth daily.      benazepril (LOTENSIN) 20 MG tablet Take 20 mg by mouth daily.      Calcium Carbonate-Vit D-Min (CALCIUM 1200 PO) Take 1 tablet by mouth 3 (three) times a week.     cyanocobalamin  (VITAMIN B12) 1000  MCG tablet Take 1,000 mcg by mouth daily.     Dulaglutide 0.75 MG/0.5ML SOPN Inject 0.75 mg into the skin once a week.      famotidine (PEPCID) 20 MG tablet Take 20 mg by mouth daily.     glucosamine-chondroitin 500-400 MG tablet Take 1 tablet by mouth daily.      Insulin Degludec (TRESIBA FLEXTOUCH [place]) Inject 13 Units into the skin at bedtime.     Loratadine 10 MG CAPS Take 10 mg by mouth daily.      metFORMIN (GLUCOPHAGE-XR) 500 MG 24 hr tablet Take 1 tablet by mouth 2 (two) times daily.     OVER THE COUNTER MEDICATION NATURAL BLACK CHERRY CONCENTRATE     repaglinide (PRANDIN) 1 MG tablet Take 1 tablet by mouth 3 (three) times daily before meals.     TRESIBA FLEXTOUCH 100 UNIT/ML FlexTouch Pen Inject into the skin.     metFORMIN (GLUCOPHAGE-XR) 500 MG 24 hr tablet Take 500 mg by mouth 2 (two) times daily.     omeprazole (PRILOSEC) 20 MG capsule Take by mouth. (Patient not taking: Reported on 03/07/2024)     No current facility-administered medications for this visit.     PHYSICAL EXAMINATION: ECOG PERFORMANCE STATUS: 1 - Symptomatic but completely ambulatory Vitals:   03/07/24 1054  BP: 134/65  Pulse: 64  Resp: 15  Temp: (!) 97.5 F (36.4 C)  SpO2: 100%   Filed Weights   03/07/24 1054  Weight: 137 lb (62.1 kg)    Physical Exam Constitutional:      General: He is not in acute distress. HENT:     Head: Normocephalic and atraumatic.  Eyes:     General: No scleral icterus. Cardiovascular:     Rate and Rhythm: Normal rate.  Pulmonary:      Effort: Pulmonary effort is normal. No respiratory distress.  Abdominal:     General: There is no distension.  Musculoskeletal:        General: No deformity. Normal range of motion.     Cervical back: Normal range of motion and neck supple.  Skin:    Findings: No rash.  Neurological:     Mental Status: He is alert and oriented to person, place, and time. Mental status is at baseline.  Psychiatric:        Mood and Affect: Mood normal.     LABORATORY DATA:  I have reviewed the data as listed    Latest Ref Rng & Units 02/27/2024   10:29 AM 08/22/2023   10:22 AM 04/18/2023   12:42 PM  CBC  WBC 4.0 - 10.5 K/uL 6.2  6.5  5.8   Hemoglobin 13.0 - 17.0 g/dL 40.9  81.1  91.4   Hematocrit 39.0 - 52.0 % 36.0  42.4  36.1   Platelets 150 - 400 K/uL 186  211  181       Latest Ref Rng & Units 02/27/2024   10:29 AM 08/22/2023   10:23 AM 04/18/2023   12:42 PM  CMP  Glucose 70 - 99 mg/dL 782  956  213   BUN 8 - 23 mg/dL 25  23  25    Creatinine 0.61 - 1.24 mg/dL 0.86  5.78  4.69   Sodium 135 - 145 mmol/L 130  135  131   Potassium 3.5 - 5.1 mmol/L 4.3  4.2  4.1   Chloride 98 - 111 mmol/L 100  103  102   CO2 22 - 32 mmol/L 22  23  20  Calcium 8.9 - 10.3 mg/dL 8.6  9.3  8.7   Total Protein 6.5 - 8.1 g/dL 6.8  7.7  6.7   Total Bilirubin 0.0 - 1.2 mg/dL 0.7  0.7  0.5   Alkaline Phos 38 - 126 U/L 51  53  46   AST 15 - 41 U/L 27  32  25   ALT 0 - 44 U/L 23  32  18      RADIOGRAPHIC STUDIES: I have personally reviewed the radiological images as listed and agreed with the findings in the report. No results found.

## 2024-03-07 NOTE — Patient Instructions (Signed)
 Skeletal survey/ bone survey- this scan is a walk-in and no appointment is needed. You may go to the medical mall at your own convenience to have this scan done.  This is due in November 2025.

## 2024-03-07 NOTE — Assessment & Plan Note (Addendum)
 IgG kappa MGUS Labs are reviewed and discussed with patient. Lab Results  Component Value Date   MPROTEIN 0.6 (H) 02/27/2024   KPAFRELGTCHN 437.6 (H) 02/27/2024   LAMBDASER 31.1 (H) 02/27/2024   KAPLAMBRATIO 14.07 (H) 02/27/2024      Slow progressing SPEP and UPEP M protein and light chain chatio.  He has normal hemoglobin and normal kidney function.   January 2024 negative skeletal survey.  Continue observation.

## 2024-03-22 DIAGNOSIS — E119 Type 2 diabetes mellitus without complications: Secondary | ICD-10-CM | POA: Diagnosis not present

## 2024-04-21 DIAGNOSIS — E119 Type 2 diabetes mellitus without complications: Secondary | ICD-10-CM | POA: Diagnosis not present

## 2024-04-30 DIAGNOSIS — I152 Hypertension secondary to endocrine disorders: Secondary | ICD-10-CM | POA: Diagnosis not present

## 2024-04-30 DIAGNOSIS — E1159 Type 2 diabetes mellitus with other circulatory complications: Secondary | ICD-10-CM | POA: Diagnosis not present

## 2024-04-30 DIAGNOSIS — E1140 Type 2 diabetes mellitus with diabetic neuropathy, unspecified: Secondary | ICD-10-CM | POA: Diagnosis not present

## 2024-04-30 DIAGNOSIS — E1122 Type 2 diabetes mellitus with diabetic chronic kidney disease: Secondary | ICD-10-CM | POA: Diagnosis not present

## 2024-04-30 DIAGNOSIS — M129 Arthropathy, unspecified: Secondary | ICD-10-CM | POA: Diagnosis not present

## 2024-04-30 DIAGNOSIS — N183 Chronic kidney disease, stage 3 unspecified: Secondary | ICD-10-CM | POA: Diagnosis not present

## 2024-05-22 DIAGNOSIS — E119 Type 2 diabetes mellitus without complications: Secondary | ICD-10-CM | POA: Diagnosis not present

## 2024-06-22 DIAGNOSIS — E119 Type 2 diabetes mellitus without complications: Secondary | ICD-10-CM | POA: Diagnosis not present

## 2024-06-27 DIAGNOSIS — E1142 Type 2 diabetes mellitus with diabetic polyneuropathy: Secondary | ICD-10-CM | POA: Diagnosis not present

## 2024-06-27 DIAGNOSIS — M51369 Other intervertebral disc degeneration, lumbar region without mention of lumbar back pain or lower extremity pain: Secondary | ICD-10-CM | POA: Diagnosis not present

## 2024-06-27 DIAGNOSIS — E569 Vitamin deficiency, unspecified: Secondary | ICD-10-CM | POA: Diagnosis not present

## 2024-06-27 DIAGNOSIS — Z79899 Other long term (current) drug therapy: Secondary | ICD-10-CM | POA: Diagnosis not present

## 2024-06-27 DIAGNOSIS — G5602 Carpal tunnel syndrome, left upper limb: Secondary | ICD-10-CM | POA: Diagnosis not present

## 2024-06-27 DIAGNOSIS — Z8673 Personal history of transient ischemic attack (TIA), and cerebral infarction without residual deficits: Secondary | ICD-10-CM | POA: Diagnosis not present

## 2024-07-03 DIAGNOSIS — I152 Hypertension secondary to endocrine disorders: Secondary | ICD-10-CM | POA: Diagnosis not present

## 2024-07-03 DIAGNOSIS — E1169 Type 2 diabetes mellitus with other specified complication: Secondary | ICD-10-CM | POA: Diagnosis not present

## 2024-07-03 DIAGNOSIS — E1159 Type 2 diabetes mellitus with other circulatory complications: Secondary | ICD-10-CM | POA: Diagnosis not present

## 2024-07-03 DIAGNOSIS — E785 Hyperlipidemia, unspecified: Secondary | ICD-10-CM | POA: Diagnosis not present

## 2024-07-03 DIAGNOSIS — E119 Type 2 diabetes mellitus without complications: Secondary | ICD-10-CM | POA: Diagnosis not present

## 2024-07-03 DIAGNOSIS — Z794 Long term (current) use of insulin: Secondary | ICD-10-CM | POA: Diagnosis not present

## 2024-07-22 DIAGNOSIS — E119 Type 2 diabetes mellitus without complications: Secondary | ICD-10-CM | POA: Diagnosis not present

## 2024-07-31 DIAGNOSIS — Z1331 Encounter for screening for depression: Secondary | ICD-10-CM | POA: Diagnosis not present

## 2024-07-31 DIAGNOSIS — L989 Disorder of the skin and subcutaneous tissue, unspecified: Secondary | ICD-10-CM | POA: Diagnosis not present

## 2024-08-15 ENCOUNTER — Ambulatory Visit

## 2024-08-15 DIAGNOSIS — W908XXA Exposure to other nonionizing radiation, initial encounter: Secondary | ICD-10-CM

## 2024-08-15 DIAGNOSIS — L820 Inflamed seborrheic keratosis: Secondary | ICD-10-CM

## 2024-08-15 DIAGNOSIS — L578 Other skin changes due to chronic exposure to nonionizing radiation: Secondary | ICD-10-CM | POA: Diagnosis not present

## 2024-08-15 DIAGNOSIS — L814 Other melanin hyperpigmentation: Secondary | ICD-10-CM | POA: Diagnosis not present

## 2024-08-15 DIAGNOSIS — L821 Other seborrheic keratosis: Secondary | ICD-10-CM

## 2024-08-15 NOTE — Progress Notes (Signed)
    Subjective   Terry Vazquez is a 83 y.o. male who presents for the following: Lesion(s) of concern . Patient is new patient  Today patient reports: Irregular skin lesion behind the R ear, patient's hair stylist noticed it and recommended him have it checked. She accidentally clipped it when cutting his hair.   Review of Systems:    No other skin or systemic complaints except as noted in HPI or Assessment and Plan.  The following portions of the chart were reviewed this encounter and updated as appropriate: medications, allergies, medical history  Relevant Medical History:  n/a   Objective  (SKPE) Well appearing patient in no apparent distress; mood and affect are within normal limits. Examination was performed of the: Focused Exam of: the face, scalp, neck, and ears   Examination notable for: Lentigo/lentigines: Scattered pigmented macules that are tan to brown in color and are somewhat non-uniform in shape and concentrated in the sun-exposed areas, Seborrheic Keratosis(es): Stuck-on appearing keratotic papule(s) on the trunk, some  irritated with redness, crusting, edema, and/or partial avulsion, Actinic Damage/Elastosis: chronic sun damage: dyspigmentation, telangiectasia, and wrinkling  Examination limited by: n/a   R post auricular/neck x 4 (4) Erythematous stuck-on, waxy papule or plaque  Assessment & Plan  (SKAP)   BENIGN SKIN FINDINGS  - Lentigines  - Seborrheic keratoses - Reassurance provided regarding the benign appearance of lesions noted on exam today; no treatment is indicated in the absence of symptoms/changes. - Reinforced importance of photoprotective strategies including liberal and frequent sunscreen use of a broad-spectrum SPF 30 or greater, use of protective clothing, and sun avoidance for prevention of cutaneous malignancy and photoaging.  Counseled patient on the importance of regular self-skin monitoring as well as routine clinical skin examinations as scheduled.    ACTINIC DAMAGE - Chronic condition, secondary to cumulative UV/sun exposure - Recommend daily broad spectrum sunscreen SPF 30+ to sun-exposed areas, reapply every 2 hours as needed.  - Staying in the shade or wearing long sleeves, sun glasses (UVA+UVB protection) and wide brim hats (4-inch brim around the entire circumference of the hat) are also recommended for sun protection.  - Call for new or changing lesions.  Procedures, orders, diagnosis for this visit:  INFLAMED SEBORRHEIC KERATOSIS (4) R post auricular/neck x 4 (4) Symptomatic, irritating, patient would like treated.  Destruction of lesion - R post auricular/neck x 4 (4) Complexity: simple   Destruction method: cryotherapy   Informed consent: discussed and consent obtained   Timeout:  patient name, date of birth, surgical site, and procedure verified Lesion destroyed using liquid nitrogen: Yes   Region frozen until ice ball extended beyond lesion: Yes   Cryo cycles: 1 or 2. Outcome: patient tolerated procedure well with no complications   Post-procedure details: wound care instructions given     Inflamed seborrheic keratosis -     Destruction of lesion    Return to clinic: Return if symptoms worsen or fail to improve.  LILLETTE Rosina Mayans, CMA, am acting as scribe for Lauraine JAYSON Kanaris, MD .   Documentation: I have reviewed the above documentation for accuracy and completeness, and I agree with the above.  Lauraine JAYSON Kanaris, MD

## 2024-08-15 NOTE — Patient Instructions (Addendum)
 Seborrheic Keratosis  What causes seborrheic keratoses? Seborrheic keratoses are harmless, common skin growths that first appear during adult life.  As time goes by, more growths appear.  Some people may develop a large number of them.  Seborrheic keratoses appear on both covered and uncovered body parts.  They are not caused by sunlight.  The tendency to develop seborrheic keratoses can be inherited.  They vary in color from skin-colored to gray, brown, or even black.  They can be either smooth or have a rough, warty surface.   Seborrheic keratoses are superficial and look as if they were stuck on the skin.  Under the microscope this type of keratosis looks like layers upon layers of skin.  That is why at times the top layer may seem to fall off, but the rest of the growth remains and re-grows.    Treatment Seborrheic keratoses do not need to be treated, but can easily be removed in the office.  Seborrheic keratoses often cause symptoms when they rub on clothing or jewelry.  Lesions can be in the way of shaving.  If they become inflamed, they can cause itching, soreness, or burning.  Removal of a seborrheic keratosis can be accomplished by freezing, burning, or surgery. If any spot bleeds, scabs, or grows rapidly, please return to have it checked, as these can be an indication of a skin cancer.  Due to recent changes in healthcare laws, you may see results of your pathology and/or laboratory studies on MyChart before the doctors have had a chance to review them. We understand that in some cases there may be results that are confusing or concerning to you. Please understand that not all results are received at the same time and often the doctors may need to interpret multiple results in order to provide you with the best plan of care or course of treatment. Therefore, we ask that you please give us  2 business days to thoroughly review all your results before contacting the office for clarification. Should  we see a critical lab result, you will be contacted sooner.   If You Need Anything After Your Visit  If you have any questions or concerns for your doctor, please call our main line at [phone] and press option 4 to reach your doctor's medical assistant. If no one answers, please leave a voicemail as directed and we will return your call as soon as possible. Messages left after 4 pm will be answered the following business day.   You may also send us  a message via MyChart. We typically respond to MyChart messages within 1-2 business days.  For prescription refills, please ask your pharmacy to contact our office. Our fax number is [phone].  If you have an urgent issue when the clinic is closed that cannot wait until the next business day, you can page your doctor at the number below.    Please note that while we do our best to be available for urgent issues outside of office hours, we are not available 24/7.   If you have an urgent issue and are unable to reach us , you may choose to seek medical care at your doctor's office, retail clinic, urgent care center, or emergency room.  If you have a medical emergency, please immediately call 911 or go to the emergency department.  Pager Numbers  - Dr. Hester: [phone]  - Dr. Jackquline: [phone]  - Dr. Claudene: [phone]   - Dr. Raymund: [phone]  In the event of inclement weather,  please call our main line at [phone] for an update on the status of any delays or closures.  Dermatology Medication Tips: Please keep the boxes that topical medications come in in order to help keep track of the instructions about where and how to use these. Pharmacies typically print the medication instructions only on the boxes and not directly on the medication tubes.   If your medication is too expensive, please contact our office at [phone] option 4 or send us  a message through MyChart.   We are unable to tell what your co-pay  for medications will be in advance as this is different depending on your insurance coverage. However, we may be able to find a substitute medication at lower cost or fill out paperwork to get insurance to cover a needed medication.   If a prior authorization is required to get your medication covered by your insurance company, please allow us  1-2 business days to complete this process.  Drug prices often vary depending on where the prescription is filled and some pharmacies may offer cheaper prices.  The website www.goodrx.com contains coupons for medications through different pharmacies. The prices here do not account for what the cost may be with help from insurance (it may be cheaper with your insurance), but the website can give you the price if you did not use any insurance.  - You can print the associated coupon and take it with your prescription to the pharmacy.  - You may also stop by our office during regular business hours and pick up a GoodRx coupon card.  - If you need your prescription sent electronically to a different pharmacy, notify our office through [facility] or by phone at [phone] option 4.     Si Usted Necesita Algo Despus de Su Visita  Tambin puede enviarnos un mensaje a travs de Clinical cytogeneticist. Por lo general respondemos a los mensajes de MyChart en el transcurso de 1 a 2 das hbiles.  Para renovar recetas, por favor pida a su farmacia que se ponga en contacto con nuestra oficina. Randi lakes de fax es [place] [phone].  Si tiene un asunto urgente cuando la clnica est cerrada y que no puede esperar hasta el siguiente da hbil, puede llamar/localizar a su doctor(a) al nmero que aparece a continuacin.   Por favor, tenga en cuenta que aunque hacemos todo lo posible para estar disponibles para asuntos urgentes fuera del horario de [place], no estamos disponibles las 24 horas del da, los 7 [address]  [address] de la [place].   Si tiene un problema urgente y no puede  comunicarse con nosotros, puede optar por buscar atencin mdica  en el consultorio de su doctor(a), en una clnica privada, en un centro de atencin urgente o en una sala de emergencias.  Si tiene Engineer, drilling, por favor llame inmediatamente al 911 o vaya a la sala de emergencias.  Nmeros de bper  - Dr. Hester: [phone]  - Dra. Jackquline: [phone]  - Dr. Claudene: [phone]  - Dra. Kitts: [phone]  En caso de inclemencias del [place], por favor llame a nuestra lnea principal al [phone] para una actualizacin sobre el estado de cualquier retraso o cierre.  Consejos para la medicacin en dermatologa: Por favor, guarde las cajas en las que vienen los medicamentos de uso tpico para ayudarle a seguir las instrucciones sobre dnde y cmo usarlos. Las farmacias generalmente imprimen las instrucciones del medicamento slo en las cajas y no directamente en los tubos del [place].   Si  su medicamento es muy caro, por favor, pngase en contacto con landry rieger llamando al [phone] y presione la opcin 4 o envenos un mensaje a travs de Clinical cytogeneticist.   No podemos decirle cul ser su copago por los medicamentos por adelantado ya que esto es diferente dependiendo de la cobertura de su seguro. Sin embargo, es posible que podamos encontrar un medicamento sustituto a Audiological scientist un formulario para que el seguro cubra el medicamento que se considera necesario.   Si se requiere una autorizacin previa para que su compaa de seguros malta su medicamento, por favor permtanos de 1 a 2 das hbiles para completar este proceso.  Los precios de los medicamentos varan con frecuencia dependiendo del Environmental consultant de dnde se surte la receta y alguna farmacias pueden ofrecer precios ms baratos.  El sitio web www.goodrx.com tiene cupones para medicamentos de Health and safety inspector. Los precios aqu no tienen en cuenta lo que podra costar con la ayuda del seguro (puede ser ms  barato con su seguro), pero el sitio web puede darle el precio si no utiliz Tourist information centre manager.  - Puede imprimir el cupn correspondiente y llevarlo con su receta a la farmacia.  - Tambin puede pasar por nuestra oficina durante el horario de atencin regular y Education officer, museum una tarjeta de cupones de GoodRx.  - Si necesita que su receta se enve electrnicamente a una farmacia diferente, informe a nuestra oficina a travs de MyChart de  o por telfono llamando al [phone] y presione la opcin 4.

## 2024-08-21 DIAGNOSIS — E7800 Pure hypercholesterolemia, unspecified: Secondary | ICD-10-CM | POA: Diagnosis not present

## 2024-08-21 DIAGNOSIS — Z794 Long term (current) use of insulin: Secondary | ICD-10-CM | POA: Diagnosis not present

## 2024-08-21 DIAGNOSIS — I152 Hypertension secondary to endocrine disorders: Secondary | ICD-10-CM | POA: Diagnosis not present

## 2024-08-21 DIAGNOSIS — E1159 Type 2 diabetes mellitus with other circulatory complications: Secondary | ICD-10-CM | POA: Diagnosis not present

## 2024-08-21 DIAGNOSIS — E1140 Type 2 diabetes mellitus with diabetic neuropathy, unspecified: Secondary | ICD-10-CM | POA: Diagnosis not present

## 2024-08-22 DIAGNOSIS — E119 Type 2 diabetes mellitus without complications: Secondary | ICD-10-CM | POA: Diagnosis not present

## 2024-08-28 DIAGNOSIS — M130 Polyarthritis, unspecified: Secondary | ICD-10-CM | POA: Diagnosis not present

## 2024-08-28 DIAGNOSIS — E1122 Type 2 diabetes mellitus with diabetic chronic kidney disease: Secondary | ICD-10-CM | POA: Diagnosis not present

## 2024-08-28 DIAGNOSIS — E1140 Type 2 diabetes mellitus with diabetic neuropathy, unspecified: Secondary | ICD-10-CM | POA: Diagnosis not present

## 2024-08-28 DIAGNOSIS — K219 Gastro-esophageal reflux disease without esophagitis: Secondary | ICD-10-CM | POA: Diagnosis not present

## 2024-08-28 DIAGNOSIS — E1159 Type 2 diabetes mellitus with other circulatory complications: Secondary | ICD-10-CM | POA: Diagnosis not present

## 2024-08-28 DIAGNOSIS — D472 Monoclonal gammopathy: Secondary | ICD-10-CM | POA: Diagnosis not present

## 2024-08-28 DIAGNOSIS — N401 Enlarged prostate with lower urinary tract symptoms: Secondary | ICD-10-CM | POA: Diagnosis not present

## 2024-08-28 DIAGNOSIS — Z1331 Encounter for screening for depression: Secondary | ICD-10-CM | POA: Diagnosis not present

## 2024-08-28 DIAGNOSIS — Z0000 Encounter for general adult medical examination without abnormal findings: Secondary | ICD-10-CM | POA: Diagnosis not present

## 2024-08-28 DIAGNOSIS — E7800 Pure hypercholesterolemia, unspecified: Secondary | ICD-10-CM | POA: Diagnosis not present

## 2024-09-03 ENCOUNTER — Ambulatory Visit
Admission: RE | Admit: 2024-09-03 | Discharge: 2024-09-03 | Disposition: A | Discharge status: Home / Self Care | Source: Ambulatory Visit | Attending: Oncology | Admitting: Oncology

## 2024-09-03 ENCOUNTER — Ambulatory Visit
Admission: RE | Admit: 2024-09-03 | Discharge: 2024-09-03 | Disposition: A | Discharge status: Home / Self Care | Attending: Oncology | Admitting: Oncology

## 2024-09-03 DIAGNOSIS — M47812 Spondylosis without myelopathy or radiculopathy, cervical region: Secondary | ICD-10-CM | POA: Diagnosis not present

## 2024-09-03 DIAGNOSIS — M47814 Spondylosis without myelopathy or radiculopathy, thoracic region: Secondary | ICD-10-CM | POA: Diagnosis not present

## 2024-09-03 DIAGNOSIS — D472 Monoclonal gammopathy: Secondary | ICD-10-CM | POA: Insufficient documentation

## 2024-09-09 ENCOUNTER — Other Ambulatory Visit: Payer: Self-pay

## 2024-09-09 ENCOUNTER — Inpatient Hospital Stay: Attending: Oncology

## 2024-09-09 DIAGNOSIS — N183 Chronic kidney disease, stage 3 unspecified: Secondary | ICD-10-CM | POA: Diagnosis not present

## 2024-09-09 DIAGNOSIS — D472 Monoclonal gammopathy: Secondary | ICD-10-CM | POA: Diagnosis present

## 2024-09-09 DIAGNOSIS — E1122 Type 2 diabetes mellitus with diabetic chronic kidney disease: Secondary | ICD-10-CM | POA: Insufficient documentation

## 2024-09-09 DIAGNOSIS — M549 Dorsalgia, unspecified: Secondary | ICD-10-CM | POA: Insufficient documentation

## 2024-09-09 DIAGNOSIS — R531 Weakness: Secondary | ICD-10-CM | POA: Insufficient documentation

## 2024-09-09 DIAGNOSIS — E1142 Type 2 diabetes mellitus with diabetic polyneuropathy: Secondary | ICD-10-CM | POA: Insufficient documentation

## 2024-09-09 LAB — CMP (CANCER CENTER ONLY)
ALT: 23 U/L (ref 0–44)
AST: 28 U/L (ref 15–41)
Albumin: 4.5 g/dL (ref 3.5–5.0)
Alkaline Phosphatase: 54 U/L (ref 38–126)
Anion gap: 10 (ref 5–15)
BUN: 24 mg/dL — ABNORMAL HIGH (ref 8–23)
CO2: 23 mmol/L (ref 22–32)
Calcium: 9.7 mg/dL (ref 8.9–10.3)
Chloride: 103 mmol/L (ref 98–111)
Creatinine: 1.29 mg/dL — ABNORMAL HIGH (ref 0.61–1.24)
GFR, Estimated: 55 mL/min — ABNORMAL LOW (ref >60)
Glucose, Bld: 129 mg/dL — ABNORMAL HIGH (ref 70–99)
Potassium: 4.2 mmol/L (ref 3.5–5.1)
Sodium: 136 mmol/L (ref 135–145)
Total Bilirubin: 0.9 mg/dL (ref 0.0–1.2)
Total Protein: 8.1 g/dL (ref 6.5–8.1)

## 2024-09-09 LAB — CBC WITH DIFFERENTIAL (CANCER CENTER ONLY)
Abs Immature Granulocytes: 0.02 K/uL (ref 0.00–0.07)
Basophils Absolute: 0 K/uL (ref 0.0–0.1)
Basophils Relative: 1 %
Eosinophils Absolute: 0 K/uL (ref 0.0–0.5)
Eosinophils Relative: 1 %
HCT: 40.8 % (ref 39.0–52.0)
Hemoglobin: 13.7 g/dL (ref 13.0–17.0)
Immature Granulocytes: 0 %
Lymphocytes Relative: 21 %
Lymphs Abs: 1.6 K/uL (ref 0.7–4.0)
MCH: 30.7 pg (ref 26.0–34.0)
MCHC: 33.6 g/dL (ref 30.0–36.0)
MCV: 91.5 fL (ref 80.0–100.0)
Monocytes Absolute: 0.5 K/uL (ref 0.1–1.0)
Monocytes Relative: 7 %
Neutro Abs: 5.7 K/uL (ref 1.7–7.7)
Neutrophils Relative %: 70 %
Platelet Count: 221 K/uL (ref 150–400)
RBC: 4.46 MIL/uL (ref 4.22–5.81)
RDW: 12.8 % (ref 11.5–15.5)
WBC Count: 8 K/uL (ref 4.0–10.5)
nRBC: 0 % (ref 0.0–0.2)

## 2024-09-10 LAB — KAPPA/LAMBDA LIGHT CHAINS
Kappa free light chain: 608.1 mg/L — ABNORMAL HIGH (ref 3.3–19.4)
Kappa, lambda light chain ratio: 17.78 — ABNORMAL HIGH (ref 0.26–1.65)
Lambda free light chains: 34.2 mg/L — ABNORMAL HIGH (ref 5.7–26.3)

## 2024-09-11 LAB — MULTIPLE MYELOMA PANEL, SERUM
Albumin SerPl Elph-Mcnc: 3.7 g/dL (ref 2.9–4.4)
Albumin/Glob SerPl: 1.2 (ref 0.7–1.7)
Alpha 1: 0.2 g/dL (ref 0.0–0.4)
Alpha2 Glob SerPl Elph-Mcnc: 0.8 g/dL (ref 0.4–1.0)
B-Globulin SerPl Elph-Mcnc: 0.9 g/dL (ref 0.7–1.3)
Gamma Glob SerPl Elph-Mcnc: 1.2 g/dL (ref 0.4–1.8)
Globulin, Total: 3.2 g/dL (ref 2.2–3.9)
IgA: 151 mg/dL (ref 61–437)
IgG (Immunoglobin G), Serum: 1411 mg/dL (ref 603–1613)
IgM (Immunoglobulin M), Srm: 74 mg/dL (ref 15–143)
M Protein SerPl Elph-Mcnc: 0.8 g/dL — ABNORMAL HIGH
Total Protein ELP: 6.9 g/dL (ref 6.0–8.5)

## 2024-09-18 ENCOUNTER — Encounter: Payer: Self-pay | Admitting: Oncology

## 2024-09-18 ENCOUNTER — Inpatient Hospital Stay: Admitting: Oncology

## 2024-09-18 ENCOUNTER — Telehealth: Payer: Self-pay

## 2024-09-18 VITALS — BP 150/63 | HR 69 | Temp 96.0°F | Resp 18 | Wt 137.0 lb

## 2024-09-18 DIAGNOSIS — D472 Monoclonal gammopathy: Secondary | ICD-10-CM | POA: Diagnosis not present

## 2024-09-18 DIAGNOSIS — N1831 Chronic kidney disease, stage 3a: Secondary | ICD-10-CM | POA: Diagnosis not present

## 2024-09-18 LAB — IFE+PROTEIN ELECTRO, 24-HR UR
% BETA, Urine: 85.4 %
ALPHA 1 URINE: 0.7 %
Albumin, U: 3.6 %
Alpha 2, Urine: 4.2 %
GAMMA GLOBULIN URINE: 6.1 %
M-SPIKE %, Urine: 70.8 % — ABNORMAL HIGH
M-Spike, Mg/24 Hr: 506 mg/(24.h) — ABNORMAL HIGH
Total Protein, Urine-Ur/day: 714 mg/(24.h) — ABNORMAL HIGH (ref 30–150)
Total Protein, Urine: 25.5 mg/dL
Total Volume: 2800

## 2024-09-18 NOTE — Telephone Encounter (Signed)
 Request for Bone marrow bx sent to IR.   MD will be scheduled 1-2 weeks after bx.

## 2024-09-18 NOTE — Assessment & Plan Note (Addendum)
 IgG kappa MGUS Labs are reviewed and discussed with patient. Lab Results  Component Value Date   MPROTEIN 0.8 (H) 09/09/2024   KPAFRELGTCHN 608.1 (H) 09/09/2024   LAMBDASER 34.2 (H) 09/09/2024   KAPLAMBRATIO 17.78 (H) 09/09/2024      Patient has progressing SPEP -M protein 0.8, and UPEP M protein 500, and light chain chatio.  He has normal hemoglobin, kidney function starts to get worse.  Suspect that patient has progressed to multiple myeloma. 09/11/24 negative skeletal survey.  Recommend bone marrow biopsy

## 2024-09-18 NOTE — Assessment & Plan Note (Signed)
 Encourage oral hydration and avoid nephrotoxins.  Cr is rising. 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

## 2024-09-18 NOTE — Progress Notes (Signed)
 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 Lab Results  Component Value Date   MPROTEIN 0.8 (H) 09/09/2024   KPAFRELGTCHN 608.1 (H) 09/09/2024   LAMBDASER 34.2 (H) 09/09/2024   KAPLAMBRATIO 17.78 (H) 09/09/2024      Patient has progressing SPEP -M protein 0.8, and UPEP M protein 500, and light chain chatio.  He has normal hemoglobin, kidney function starts to get worse.  Suspect that patient has progressed to multiple myeloma. 09/11/24 negative skeletal survey.  Recommend bone marrow biopsy   CKD (chronic kidney disease) stage 3, GFR 30-59 ml/min (HCC) Encourage oral hydration and avoid nephrotoxins.  Cr is rising. 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   All questions were answered. The patient knows to call the clinic with any problems, questions or concerns.  Call Babara, MD, PhD [facility] Hematology Oncology 09/18/2024    CHIEF COMPLAINTS/REASON FOR VISIT:  Follow up for MGUS  HISTORY OF PRESENTING ILLNESS:   Terry Vazquez is a  83 y.o.  male presents for follow up of MGUS.   Patient recently was seen by neurology for left leg weakness, back pain, diabetic polyneuropathy.  He had work up done.  08/20/2021 SPEP showed M spike of 0.5g/dl, IgG kappa.  09/14/2021 Free kappa light chain 132.9,  lamda light chain 36.9, kappa/lamda ratio 3.6 He was referred to hematology for further evaluation.    INTERVAL HISTORY Patient feels well today.  Patient reports no new complaints.  He reports hydrating well.  Denies any NSAIDs use. Wife recently had a fall and got some soft tissue injury.     Review of Systems  Constitutional:  Negative for appetite change, chills, fatigue, fever and unexpected weight change.  HENT:   Negative for hearing loss and voice change.   Eyes:  Negative for eye problems and icterus.  Respiratory:  Negative for chest tightness, cough and shortness of breath.   Cardiovascular:  Negative for chest pain and leg swelling.  Gastrointestinal:  Negative for abdominal distention and abdominal pain.  Endocrine: Negative for hot flashes.  Genitourinary:  Negative for difficulty urinating, dysuria and frequency.   Musculoskeletal:  Negative for arthralgias.  Skin:  Negative for itching and rash.  Neurological:  Positive for extremity weakness. Negative for light-headedness and numbness.  Hematological:  Negative for adenopathy. Does not bruise/bleed easily.  Psychiatric/Behavioral:  Negative for confusion.     MEDICAL HISTORY:  Past Medical History:  Diagnosis Date   Cataract extraction status of right eye    CKD (chronic kidney disease)    Diabetes mellitus without complication (HCC)    Hyperlipidemia    Hypertension    Hypertension associated with diabetes (HCC)    Migraine without status migrainosus, not intractable    Other seasonal allergic rhinitis  Puckering of macula    Vitreous degeneration of left eye     SURGICAL HISTORY: Past Surgical History:  Procedure Laterality Date   CATARACT EXTRACTION, BILATERAL     Left Ring and Long Trigger Finger Release     TONSILLECTOMY     TONSILLECTOMY AND ADENOIDECTOMY     TOOTH EXTRACTION      SOCIAL HISTORY: Social History   Socioeconomic History   Marital status: Married    Spouse name: Not on file    Number of children: Not on file   Years of education: Not on file   Highest education level: Not on file  Occupational History   Not on file  Tobacco Use   Smoking status: Never   Smokeless tobacco: Never  Vaping Use   Vaping status: Never Used  Substance and Sexual Activity   Alcohol use: Never   Drug use: Never   Sexual activity: Not on file  Other Topics Concern   Not on file  Social History Narrative   Not on file   Social Drivers of Health   Financial Resource Strain: Low Risk  (08/28/2024)   Received from [facility]   Overall Financial Resource Strain (CARDIA)    Difficulty of Paying Living Expenses: Not hard at all  Food Insecurity: No Food Insecurity (08/28/2024)   Received from [facility]   Hunger Vital Sign    Within the past 12 months, you worried that your food would run out before you got the money to buy more.: Never true    Within the past 12 months, the food you bought just didn't last and you didn't have money to get more.: Never true  Transportation Needs: No Transportation Needs (08/28/2024)   Received from [facility] - Transportation    In the past 12 months, has lack of transportation kept you from medical appointments or from getting medications?: No    Lack of Transportation (Non-Medical): No  Physical Activity: Not on file  Stress: Not on file  Social Connections: Not on file  Intimate Partner Violence: Not on file    FAMILY HISTORY: Family History  Problem Relation Age of Onset   Diabetes Mother    Parkinson's disease Mother    Heart failure Father    Kidney disease Father    Rheum arthritis Sister    Diabetes Brother    Heart failure Brother    Arthritis Brother    Diabetes Other        multiple cousins with diabetes    ALLERGIES:  has no known allergies.  MEDICATIONS:  Current Outpatient Medications  Medication Sig Dispense Refill   aspirin EC 81 MG tablet Take  81 mg by mouth daily.      atorvastatin (LIPITOR) 20 MG tablet Take 20 mg by mouth daily.      benazepril (LOTENSIN) 20 MG tablet Take 20 mg by mouth daily.      Calcium Carbonate-Vit D-Min (CALCIUM 1200 PO) Take 1 tablet by mouth 3 (three) times a week.     cyanocobalamin  (VITAMIN B12) 1000 MCG tablet Take 1,000 mcg by mouth daily.     Dulaglutide 0.75 MG/0.5ML SOPN Inject 0.75 mg into the skin once a week.      famotidine (PEPCID) 20 MG tablet Take 20 mg by mouth daily.     fluticasone (FLONASE) 50 MCG/ACT nasal spray Place 1 spray into both nostrils daily.     glucosamine-chondroitin 500-400 MG tablet  Take 1 tablet by mouth daily.      Insulin Degludec (TRESIBA FLEXTOUCH [place]) Inject 13 Units into the skin at bedtime. (Patient taking differently: Inject 13 Units into the skin at bedtime. Patient takes 7 units now.)     Loratadine 10 MG CAPS Take 10 mg by mouth daily.      metFORMIN (GLUCOPHAGE-XR) 500 MG 24 hr tablet Take 500 mg by mouth 2 (two) times daily.     metFORMIN (GLUCOPHAGE-XR) 500 MG 24 hr tablet Take 1 tablet by mouth 2 (two) times daily.     omeprazole (PRILOSEC) 20 MG capsule Take by mouth.     OVER THE COUNTER MEDICATION NATURAL BLACK CHERRY CONCENTRATE     repaglinide (PRANDIN) 1 MG tablet Take 1 tablet by mouth 3 (three) times daily before meals.     Saw Palmetto 450 MG CAPS Take 1 capsule by mouth daily.     TRESIBA FLEXTOUCH 100 UNIT/ML FlexTouch Pen Inject into the skin.     No current facility-administered medications for this visit.     PHYSICAL EXAMINATION: ECOG PERFORMANCE STATUS: 1 - Symptomatic but completely ambulatory Vitals:   09/18/24 1106 09/18/24 1115  BP: (!) 148/73 (!) 150/63  Pulse: 69   Resp: 18   Temp: (!) 96 F (35.6 C)   SpO2: 100%    Filed Weights   09/18/24 1106  Weight: 137 lb (62.1 kg)    Physical Exam Constitutional:      General: He is not in acute distress. HENT:     Head: Normocephalic and atraumatic.  Eyes:     General: No  scleral icterus. Cardiovascular:     Rate and Rhythm: Normal rate.  Pulmonary:     Effort: Pulmonary effort is normal. No respiratory distress.  Abdominal:     General: There is no distension.  Musculoskeletal:        General: No deformity. Normal range of motion.     Cervical back: Normal range of motion and neck supple.  Skin:    Findings: No rash.  Neurological:     Mental Status: He is alert and oriented to person, place, and time. Mental status is at baseline.  Psychiatric:        Mood and Affect: Mood normal.     LABORATORY DATA:  I have reviewed the data as listed    Latest Ref Rng & Units 09/09/2024   10:29 AM 02/27/2024   10:29 AM 08/22/2023   10:22 AM  CBC  WBC 4.0 - 10.5 K/uL 8.0  6.2  6.5   Hemoglobin 13.0 - 17.0 g/dL 86.2  87.7  85.8   Hematocrit 39.0 - 52.0 % 40.8  36.0  42.4   Platelets 150 - 400 K/uL 221  186  211       Latest Ref Rng & Units 09/09/2024   10:29 AM 02/27/2024   10:29 AM 08/22/2023   10:23 AM  CMP  Glucose 70 - 99 mg/dL 870  837  855   BUN 8 - 23 mg/dL 24  25  23    Creatinine 0.61 - 1.24 mg/dL 8.70  8.77  8.90   Sodium 135 - 145 mmol/L 136  130  135   Potassium 3.5 - 5.1 mmol/L 4.2  4.3  4.2   Chloride 98 - 111 mmol/L 103  100  103   CO2 22 - 32 mmol/L 23  22  23    Calcium 8.9 - 10.3 mg/dL 9.7  8.6  9.3   Total  Protein 6.5 - 8.1 g/dL 8.1  6.8  7.7   Total Bilirubin 0.0 - 1.2 mg/dL 0.9  0.7  0.7   Alkaline Phos 38 - 126 U/L 54  51  53   AST 15 - 41 U/L 28  27  32   ALT 0 - 44 U/L 23  23  32      RADIOGRAPHIC STUDIES: I have personally reviewed the radiological images as listed and agreed with the findings in the report. DG Bone Survey Met Result Date: 09/11/2024 EXAM: BONE SURVEY RADIOGRAPHS, MULTIPLE AP AND LATERAL VIEWS 09/03/2024 01:44:30 PM TECHNIQUE: Multiple AP and lateral xray views of the axial skeleton and AP views of the appendicular skeleton were performed. COMPARISON: None available. CLINICAL HISTORY: MGUS FINDINGS: Skull:  No lytic or blastic lesions identified. Spine: No focal lytic lesions identified. Degenerative changes are seen within the lumbar spine at L3-L4 and L5-S1. Multilevel facet arthrosis noted within the cervical spine, most severe on the right at C3-C6. Advanced degenerative disc disease C6-C7. Degenerative changes noted within the thoracic spine. Chest: No lytic or blastic lesions identified. Lungs are clear. Mild right AC degenerative arthritis. Cardiac size within normal limits. Pelvis and Hips: No lytic or blastic lesions identified. Upper Limbs: No lytic or blastic lesions identified. Vascular calcifications noted within the upper extremities bilaterally. Lower Limbs: No lytic or blastic lesions identified. Vascular calcifications noted within the lower extremities bilaterally. IMPRESSION: 1. No focal lytic lesions identified. Incidental findings as noted above. Electronically signed by: Dorethia Molt MD 09/11/2024 04:15 AM EST RP Workstation: [id number]

## 2024-09-19 NOTE — Telephone Encounter (Addendum)
 pt scheduled for Bmbx on 12/18 @ 8:30a, arrive 7:30a. Pt aware of appt.   MD follow up on 12/24

## 2024-09-25 ENCOUNTER — Telehealth: Payer: Self-pay

## 2024-09-25 ENCOUNTER — Telehealth: Payer: Self-pay | Admitting: *Deleted

## 2024-09-25 ENCOUNTER — Other Ambulatory Visit: Payer: Self-pay | Admitting: Radiology

## 2024-09-25 DIAGNOSIS — D472 Monoclonal gammopathy: Secondary | ICD-10-CM

## 2024-09-25 NOTE — Telephone Encounter (Signed)
 Patient called and had a question about his bone marrow biopsy procedure tomorrow. He wanted to know what medications he could take or should nto take on the day of his procedure. I reviewed the following instructions with him. Please arrive 60 minutes prior to appointment time. Also, please do not eat or drink anything after midnight (6-8 hours NPO) except blood pressure/heart/seizure medications. Please take with just a sip of water.   He thanked me for calling him back. He was able to locate this information on mychart prior to me returning his call.

## 2024-09-25 NOTE — Telephone Encounter (Signed)
 Patient for IR Bone Marrow Biopsy on Thurs 09/26/24, I called and spoke with the patient on the phone and gave pre-procedure instructions. Pt was made aware to be here at 7:30a, NPO after MN prior to procedure as well as driver post procedure/recovery/discharge. Pt stated understanding.  Called 09/25/24

## 2024-09-26 ENCOUNTER — Ambulatory Visit
Admission: RE | Admit: 2024-09-26 | Discharge: 2024-09-26 | Disposition: A | Discharge status: Home / Self Care | Source: Ambulatory Visit | Attending: Oncology | Admitting: Oncology

## 2024-09-26 ENCOUNTER — Other Ambulatory Visit: Payer: Self-pay

## 2024-09-26 ENCOUNTER — Encounter: Payer: Self-pay | Admitting: Radiology

## 2024-09-26 DIAGNOSIS — C9000 Multiple myeloma not having achieved remission: Secondary | ICD-10-CM | POA: Diagnosis not present

## 2024-09-26 DIAGNOSIS — D472 Monoclonal gammopathy: Secondary | ICD-10-CM | POA: Insufficient documentation

## 2024-09-26 HISTORY — PX: IR BONE MARROW BIOPSY & ASPIRATION: IMG5727

## 2024-09-26 LAB — CBC WITH DIFFERENTIAL/PLATELET
Abs Immature Granulocytes: 0.02 K/uL (ref 0.00–0.07)
Basophils Absolute: 0 K/uL (ref 0.0–0.1)
Basophils Relative: 1 %
Eosinophils Absolute: 0.1 K/uL (ref 0.0–0.5)
Eosinophils Relative: 1 %
HCT: 40 % (ref 39.0–52.0)
Hemoglobin: 13.6 g/dL (ref 13.0–17.0)
Immature Granulocytes: 0 %
Lymphocytes Relative: 31 %
Lymphs Abs: 1.8 K/uL (ref 0.7–4.0)
MCH: 31 pg (ref 26.0–34.0)
MCHC: 34 g/dL (ref 30.0–36.0)
MCV: 91.1 fL (ref 80.0–100.0)
Monocytes Absolute: 0.4 K/uL (ref 0.1–1.0)
Monocytes Relative: 7 %
Neutro Abs: 3.6 K/uL (ref 1.7–7.7)
Neutrophils Relative %: 60 %
Platelets: 241 K/uL (ref 150–400)
RBC: 4.39 MIL/uL (ref 4.22–5.81)
RDW: 12.7 % (ref 11.5–15.5)
WBC: 5.9 K/uL (ref 4.0–10.5)
nRBC: 0 % (ref 0.0–0.2)

## 2024-09-26 LAB — GLUCOSE, CAPILLARY: Glucose-Capillary: 146 mg/dL — ABNORMAL HIGH (ref 70–99)

## 2024-09-26 MED ORDER — HEPARIN SOD (PORK) LOCK FLUSH 100 UNIT/ML IV SOLN
500.0000 [IU] | Freq: Once | INTRAVENOUS | Status: AC
  Administered 2024-09-26: 09:00:00 500 [IU] via INTRAVENOUS

## 2024-09-26 MED ORDER — SODIUM CHLORIDE 0.9 % IV SOLN: INTRAVENOUS | Status: DC

## 2024-09-26 MED ORDER — MIDAZOLAM HCL 5 MG/5ML IJ SOLN
INTRAMUSCULAR | Status: AC | PRN
  Administered 2024-09-26: 09:00:00 1 mg via INTRAVENOUS

## 2024-09-26 MED ORDER — FENTANYL CITRATE (PF) 100 MCG/2ML IJ SOLN
INTRAMUSCULAR | Status: AC | PRN
  Administered 2024-09-26: 09:00:00 50 ug via INTRAVENOUS

## 2024-09-26 MED ORDER — FENTANYL CITRATE (PF) 100 MCG/2ML IJ SOLN
INTRAMUSCULAR | Status: AC
  Filled 2024-09-26: qty 2

## 2024-09-26 MED FILL — Heparin Sodium (Porcine) Lock Flush IV Soln 100 Unit/ML: INTRAVENOUS | Qty: 5 | Status: AC

## 2024-09-26 MED FILL — Midazolam HCl Inj 2 MG/2ML (Base Equivalent): INTRAMUSCULAR | Qty: 2 | Status: AC

## 2024-09-26 NOTE — Progress Notes (Signed)
 Patient post IR BMB per Dr Vanice, tolerated well. Received Versed  1 mg along with Fentanyl  50 mcg IV for procedure. Report given to Josette Peak /specials/9.

## 2024-09-26 NOTE — Clinical Note (Incomplete)
 Patient clinically stable post IR BMB

## 2024-09-26 NOTE — Procedures (Signed)
 Interventional Radiology Procedure Note  Procedure: FLUORO RT ILIAC BM ASP AND CORE    Complications: None  Estimated Blood Loss:  MIN  Findings: 11G ASP AND CORE    EMERSON FREDERIC SPECKING, MD

## 2024-09-26 NOTE — H&P (Signed)
 Chief Complaint: Patient was seen in consultation today for MUGS.  Referring Physician(s): Yu,Zhou  Supervising Physician: Vanice Revel  Patient Status: [facility] - Out-pt  History of Present Illness: Terry Vazquez is a 83 y.o. male with a past medical history significant for CKF, DM, HTN, HLD and MGUS who presents today for bone marrow aspiration/biopsy. Terry Vazquez was noted to have elevated SPEP and UPEP M protein in 2022 which heme/onc has been monitoring. Recently, he was noted to have progressive worsening of SPEP and UPEP elevation as wle a worsening renal function leading to concern for progression to multiple myeloma. He has been referred to IR for bone marrow aspiration/biopsy to further direct care.  Past Medical History:  Diagnosis Date   Cataract extraction status of right eye    CKD (chronic kidney disease)    Diabetes mellitus without complication (HCC)    Hyperlipidemia    Hypertension    Hypertension associated with diabetes (HCC)    Migraine without status migrainosus, not intractable    Other seasonal allergic rhinitis    Puckering of macula    Vitreous degeneration of left eye     Past Surgical History:  Procedure Laterality Date   CATARACT EXTRACTION, BILATERAL     Left Ring and Long Trigger Finger Release     TONSILLECTOMY     TONSILLECTOMY AND ADENOIDECTOMY     TOOTH EXTRACTION      Allergies: Patient has no known allergies.  Medications: Prior to Admission medications  Medication Sig Start Date End Date Taking? Authorizing Provider  aspirin EC 81 MG tablet Take 81 mg by mouth daily.    Yes [provider]  atorvastatin (LIPITOR) 20 MG tablet Take 20 mg by mouth daily.  09/25/15  Yes [provider]  benazepril (LOTENSIN) 20 MG tablet Take 20 mg by mouth daily.  09/25/15  Yes [provider]  Calcium Carbonate-Vit D-Min (CALCIUM 1200 PO) Take 1 tablet by mouth 3 (three) times a week.   Yes [provider]   cyanocobalamin  (VITAMIN B12) 1000 MCG tablet Take 1,000 mcg by mouth daily.   Yes [provider]  Dulaglutide 0.75 MG/0.5ML SOPN Inject 0.75 mg into the skin once a week.  05/24/16  Yes [provider]  famotidine (PEPCID) 20 MG tablet Take 20 mg by mouth daily.   Yes [provider]  fluticasone (FLONASE) 50 MCG/ACT nasal spray Place 1 spray into both nostrils daily.   Yes [provider]  glucosamine-chondroitin 500-400 MG tablet Take 1 tablet by mouth daily.    Yes [provider]  Insulin Degludec (TRESIBA FLEXTOUCH [place]) Inject 13 Units into the skin at bedtime. Patient taking differently: Inject 13 Units into the skin at bedtime. Patient takes 7 units now.   Yes [provider]  Loratadine 10 MG CAPS Take 10 mg by mouth daily.    Yes [provider]  metFORMIN (GLUCOPHAGE-XR) 500 MG 24 hr tablet Take 1 tablet by mouth 2 (two) times daily. 06/06/23  Yes [provider]  omeprazole (PRILOSEC) 20 MG capsule Take by mouth.   Yes [provider]  OVER THE COUNTER MEDICATION NATURAL BLACK CHERRY CONCENTRATE   Yes [provider]  Saw Palmetto 450 MG CAPS Take 1 capsule by mouth daily.   Yes [provider]  metFORMIN (GLUCOPHAGE-XR) 500 MG 24 hr tablet Take 500 mg by mouth 2 (two) times daily. 09/25/15 09/18/24  [provider]  repaglinide (PRANDIN) 1 MG tablet Take 1 tablet  by mouth 3 (three) times daily before meals. 09/14/22 09/18/24  [provider]  TRESIBA FLEXTOUCH 100 UNIT/ML FlexTouch Pen Inject into the skin.    [provider]     Family History  Problem Relation Age of Onset   Diabetes Mother    Parkinson's disease Mother    Heart failure Father    Kidney disease Father    Rheum arthritis Sister    Diabetes Brother    Heart failure Brother    Arthritis Brother    Diabetes Other        multiple cousins with diabetes    Social History   Socioeconomic  History   Marital status: Married    Spouse name: Not on file   Number of children: Not on file   Years of education: Not on file   Highest education level: Not on file  Occupational History   Not on file  Tobacco Use   Smoking status: Never   Smokeless tobacco: Never  Vaping Use   Vaping status: Never Used  Substance and Sexual Activity   Alcohol use: Never   Drug use: Never   Sexual activity: Not on file  Other Topics Concern   Not on file  Social History Narrative   Not on file   Social Drivers of Health   Tobacco Use: Low Risk (09/26/2024)   Patient History    Smoking Tobacco Use: Never    Smokeless Tobacco Use: Never    Passive Exposure: Not on file  Financial Resource Strain: Low Risk  (08/28/2024)   Received from [facility]   Overall Financial Resource Strain (CARDIA)    Difficulty of Paying Living Expenses: Not hard at all  Food Insecurity: No Food Insecurity (08/28/2024)   Received from [facility]   Epic    Within the past 12 months, you worried that your food would run out before you got the money to buy more.: Never true    Within the past 12 months, the food you bought just didn't last and you didn't have money to get more.: Never true  Transportation Needs: No Transportation Needs (08/28/2024)   Received from [facility] - Transportation    In the past 12 months, has lack of transportation kept you from medical appointments or from getting medications?: No    Lack of Transportation (Non-Medical): No  Physical Activity: Not on file  Stress: Not on file  Social Connections: Not on file  Depression (EYV7-0): Not on file  Alcohol Screen: Not on file  Housing: Low Risk  (08/28/2024)   Received from [facility]   Epic    In the last 12 months, was there a time when you were not able to pay the mortgage or rent on time?: No    In the past 12 months, how many times have you  moved where you were living?: 0    At any time in the past 12 months, were you homeless or living in a shelter (including now)?: No  Utilities: Not At Risk (08/28/2024)   Received from [facility]   Epic    In the past 12 months has the electric, gas, oil, or water company threatened to shut off services in your home?: No  Health Literacy: Not on file     Review of Systems: A 12 point ROS discussed and pertinent positives are indicated in the HPI above.  All other systems  are negative.  Review of Systems  Constitutional:  Negative for chills and fever.  Respiratory:  Negative for cough and shortness of breath.   Cardiovascular:  Negative for chest pain.  Gastrointestinal:  Negative for abdominal pain, diarrhea, nausea and vomiting.  Musculoskeletal:  Negative for back pain.  Neurological:  Negative for dizziness and headaches.    Vital Signs: BP (!) 163/80   Pulse (!) 59   Temp 97.7 F (36.5 C) (Temporal)   Resp (!) 21   Ht 5' 11 (1.803 m)   Wt 133 lb 4.8 oz (60.5 kg)   SpO2 100%   BMI 18.59 kg/m   Physical Exam Vitals reviewed.  Constitutional:      General: He is not in acute distress. HENT:     Head: Normocephalic.     Mouth/Throat:     Mouth: Mucous membranes are moist.     Pharynx: Oropharynx is clear. No oropharyngeal exudate or posterior oropharyngeal erythema.  Cardiovascular:     Rate and Rhythm: Normal rate and regular rhythm.  Pulmonary:     Effort: Pulmonary effort is normal.     Breath sounds: Normal breath sounds.  Abdominal:     General: There is no distension.     Palpations: Abdomen is soft.     Tenderness: There is no abdominal tenderness.  Skin:    General: Skin is warm and dry.  Neurological:     Mental Status: He is alert and oriented to person, place, and time.  Psychiatric:        Mood and Affect: Mood normal.        Behavior: Behavior normal.        Thought Content: Thought content normal.        Judgment: Judgment  normal.      MD Evaluation Airway: WNL Heart: WNL Abdomen: WNL Chest/ Lungs: WNL ASA  Classification: 3 Mallampati/Airway Score: Two   Imaging: DG Bone Survey Met Result Date: 09/11/2024 EXAM: BONE SURVEY RADIOGRAPHS, MULTIPLE AP AND LATERAL VIEWS 09/03/2024 01:44:30 PM TECHNIQUE: Multiple AP and lateral xray views of the axial skeleton and AP views of the appendicular skeleton were performed. COMPARISON: None available. CLINICAL HISTORY: MGUS FINDINGS: Skull: No lytic or blastic lesions identified. Spine: No focal lytic lesions identified. Degenerative changes are seen within the lumbar spine at L3-L4 and L5-S1. Multilevel facet arthrosis noted within the cervical spine, most severe on the right at C3-C6. Advanced degenerative disc disease C6-C7. Degenerative changes noted within the thoracic spine. Chest: No lytic or blastic lesions identified. Lungs are clear. Mild right AC degenerative arthritis. Cardiac size within normal limits. Pelvis and Hips: No lytic or blastic lesions identified. Upper Limbs: No lytic or blastic lesions identified. Vascular calcifications noted within the upper extremities bilaterally. Lower Limbs: No lytic or blastic lesions identified. Vascular calcifications noted within the lower extremities bilaterally. IMPRESSION: 1. No focal lytic lesions identified. Incidental findings as noted above. Electronically signed by: Dorethia Molt MD 09/11/2024 04:15 AM EST RP Workstation: [id number]    Labs:  CBC: Recent Labs    02/27/24 1029 09/09/24 1029  WBC 6.2 8.0  HGB 12.2* 13.7  HCT 36.0* 40.8  PLT 186 221    COAGS: No results for input(s): INR, APTT in the last 8760 hours.  BMP: Recent Labs    02/27/24 1029 09/09/24 1029  NA 130* 136  K 4.3 4.2  CL 100 103  CO2 22 23  GLUCOSE 162* 129*  BUN 25* 24*  CALCIUM 8.6* 9.7  CREATININE 1.22 1.29*  GFRNONAA 59* 55*    LIVER FUNCTION TESTS: Recent Labs    02/27/24 1029 09/09/24 1029  BILITOT 0.7  0.9  AST 27 28  ALT 23 23  ALKPHOS 51 54  PROT 6.8 8.1  ALBUMIN 3.7 4.5    TUMOR MARKERS: No results for input(s): AFPTM, CEA, CA199, CHROMGRNA in the last 8760 hours.  Assessment and Plan:  83 y/o M with history of MGUS now with progressive worsening concerning for progression to multiple myeloma who presents today for bone marrow aspiration/biopsy to further direct care.  Risks and benefits of bone marrow aspiration/biopsy was discussed with the patient and/or patient's family including, but not limited to bleeding, infection, damage to adjacent structures or low yield requiring additional tests.  All of the questions were answered and there is agreement to proceed.  Consent signed and in chart.  Thank you for this interesting consult.  I greatly enjoyed meeting Terry Vazquez and look forward to participating in their care.  A copy of this report was sent to the requesting provider on this date.  Electronically Signed: Clotilda DELENA Hesselbach, PA-C 09/26/2024, 7:56 AM   I spent a total of 30 Minutes  in face to face in clinical consultation, greater than 50% of which was counseling/coordinating care for MGUS.

## 2024-09-30 LAB — SURGICAL PATHOLOGY

## 2024-10-02 ENCOUNTER — Encounter: Payer: Self-pay | Admitting: Oncology

## 2024-10-02 ENCOUNTER — Inpatient Hospital Stay: Admitting: Oncology

## 2024-10-02 ENCOUNTER — Inpatient Hospital Stay

## 2024-10-02 VITALS — BP 134/61 | HR 63 | Temp 96.4°F | Resp 18 | Wt 136.6 lb

## 2024-10-02 DIAGNOSIS — D472 Monoclonal gammopathy: Secondary | ICD-10-CM

## 2024-10-02 DIAGNOSIS — N1831 Chronic kidney disease, stage 3a: Secondary | ICD-10-CM

## 2024-10-02 LAB — BASIC METABOLIC PANEL - CANCER CENTER ONLY
Anion gap: 12 (ref 5–15)
BUN: 22 mg/dL (ref 8–23)
CO2: 24 mmol/L (ref 22–32)
Calcium: 10 mg/dL (ref 8.9–10.3)
Chloride: 99 mmol/L (ref 98–111)
Creatinine: 1.26 mg/dL — ABNORMAL HIGH (ref 0.61–1.24)
GFR, Estimated: 57 mL/min — ABNORMAL LOW
Glucose, Bld: 188 mg/dL — ABNORMAL HIGH (ref 70–99)
Potassium: 4.6 mmol/L (ref 3.5–5.1)
Sodium: 135 mmol/L (ref 135–145)

## 2024-10-03 NOTE — Progress Notes (Signed)
 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  Labs are reviewed and discussed with patient. Lab Results  Component Value Date   MPROTEIN 0.8 (H) 09/09/2024   KPAFRELGTCHN 608.1 (H) 09/09/2024   LAMBDASER 34.2 (H) 09/09/2024   KAPLAMBRATIO 17.78 (H) 09/09/2024  M protein 0.8, and UPEP M protein 500, and increased light chain chatio.  He has normal hemoglobin, kidney function is borderline.  Bone marrow biopsy showed 10% of plasma cells. 09/11/24 negative skeletal survey.  Recommend PET scan whole body.  Discussed with patient about options of continue close monitor vs initiate myeloma treatments.  Will further discuss after PET scan.    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 The patient knows to call the clinic with any problems, questions or concerns.  Call Babara, MD, PhD [facility] Hematology Oncology 10/02/2024    CHIEF COMPLAINTS/REASON FOR VISIT:  Follow up for MGUS  HISTORY OF  PRESENTING ILLNESS:   Terry Vazquez is a  83 y.o.  male presents for follow up of MGUS.   Patient recently was seen by neurology for left leg weakness, back pain, diabetic polyneuropathy.  He had work up done.  08/20/2021 SPEP showed M spike of 0.5g/dl, IgG kappa.  09/14/2021 Free kappa light chain 132.9, lamda light chain 36.9, kappa/lamda ratio 3.6 He was referred to hematology for further evaluation.    INTERVAL HISTORY Patient feels well today.  Patient reports no new complaints.  He reports hydrating well.   Oncology History  Smoldering multiple myeloma  10/07/2021 Initial Diagnosis   Smoldering multiple myeloma   10/03/2024 Cancer Staging   Staging form: Multiple Myeloma, AJCC 6th Edition - Clinical stage from 10/03/2024: Stage Unknown - Signed by Babara Call, MD on 10/03/2024 Staged by: Managing physician Stage prefix: Initial diagnosis       Review of Systems  Constitutional:  Negative for appetite change, chills, fatigue, fever and unexpected weight change.  HENT:   Negative for hearing loss and voice change.   Eyes:  Negative for eye problems and icterus.  Respiratory:  Negative for chest tightness, cough and shortness of breath.   Cardiovascular:  Negative for chest pain and leg swelling.  Gastrointestinal:  Negative for abdominal distention and abdominal pain.  Endocrine: Negative for hot flashes.  Genitourinary:  Negative for difficulty urinating, dysuria and frequency.   Musculoskeletal:  Negative for arthralgias.  Skin:  Negative for itching  and rash.  Neurological:  Positive for extremity weakness. Negative for light-headedness and numbness.  Hematological:  Negative for adenopathy. Does not bruise/bleed easily.  Psychiatric/Behavioral:  Negative for confusion.     MEDICAL HISTORY:  Past Medical History:  Diagnosis Date   Cataract extraction status of right eye    CKD (chronic kidney disease)    Diabetes mellitus without complication (HCC)    Hyperlipidemia     Hypertension    Hypertension associated with diabetes (HCC)    Migraine without status migrainosus, not intractable    Other seasonal allergic rhinitis    Puckering of macula    Vitreous degeneration of left eye     SURGICAL HISTORY: Past Surgical History:  Procedure Laterality Date   CATARACT EXTRACTION, BILATERAL     IR BONE MARROW BIOPSY & ASPIRATION  09/26/2024   Left Ring and Long Trigger Finger Release     TONSILLECTOMY     TONSILLECTOMY AND ADENOIDECTOMY     TOOTH EXTRACTION      SOCIAL HISTORY: Social History   Socioeconomic History   Marital status: Married    Spouse name: Not on file   Number of children: Not on file   Years of education: Not on file   Highest education level: Not on file  Occupational History   Not on file  Tobacco Use   Smoking status: Never   Smokeless tobacco: Never  Vaping Use   Vaping status: Never Used  Substance and Sexual Activity   Alcohol use: Never   Drug use: Never   Sexual activity: Not on file  Other Topics Concern   Not on file  Social History Narrative   Not on file   Social Drivers of Health   Tobacco Use: Low Risk (10/02/2024)   Patient History    Smoking Tobacco Use: Never    Smokeless Tobacco Use: Never    Passive Exposure: Not on file  Financial Resource Strain: Low Risk  (08/28/2024)   Received from [facility]   Overall Financial Resource Strain (CARDIA)    Difficulty of Paying Living Expenses: Not hard at all  Food Insecurity: No Food Insecurity (08/28/2024)   Received from [facility]   Epic    Within the past 12 months, you worried that your food would run out before you got the money to buy more.: Never true    Within the past 12 months, the food you bought just didn't last and you didn't have money to get more.: Never true  Transportation Needs: No Transportation Needs (08/28/2024)   Received from [facility] - Transportation    In  the past 12 months, has lack of transportation kept you from medical appointments or from getting medications?: No    Lack of Transportation (Non-Medical): No  Physical Activity: Not on file  Stress: Not on file  Social Connections: Not on file  Intimate Partner Violence: Not on file  Depression (EYV7-0): Not on file  Alcohol Screen: Not on file  Housing: Low Risk  (08/28/2024)   Received from [facility]   Epic    In the last 12 months, was there a time when you were not able to pay the mortgage or rent on time?: No    In the past 12 months, how many times have you moved where you were living?: 0    At any time in the past 12 months, were you homeless or living in a  shelter (including now)?: No  Utilities: Not At Risk (08/28/2024)   Received from [facility]   Epic    In the past 12 months has the electric, gas, oil, or water company threatened to shut off services in your home?: No  Health Literacy: Not on file    FAMILY HISTORY: Family History  Problem Relation Age of Onset   Diabetes Mother    Parkinson's disease Mother    Heart failure Father    Kidney disease Father    Rheum arthritis Sister    Diabetes Brother    Heart failure Brother    Arthritis Brother    Diabetes Other        multiple cousins with diabetes    ALLERGIES:  has no known allergies.  MEDICATIONS:  Current Outpatient Medications  Medication Sig Dispense Refill   aspirin EC 81 MG tablet Take 81 mg by mouth daily.      atorvastatin (LIPITOR) 20 MG tablet Take 20 mg by mouth daily.      benazepril (LOTENSIN) 20 MG tablet Take 20 mg by mouth daily.      Calcium Carbonate-Vit D-Min (CALCIUM 1200 PO) Take 1 tablet by mouth 3 (three) times a week.     cyanocobalamin  (VITAMIN B12) 1000 MCG tablet Take 1,000 mcg by mouth daily.     Dulaglutide 0.75 MG/0.5ML SOPN Inject 0.75 mg into the skin once a week.      famotidine (PEPCID) 20 MG tablet Take 20 mg by mouth daily.      fluticasone (FLONASE) 50 MCG/ACT nasal spray Place 1 spray into both nostrils daily.     glucosamine-chondroitin 500-400 MG tablet Take 1 tablet by mouth daily.      Insulin Degludec (TRESIBA FLEXTOUCH [place]) Inject 13 Units into the skin at bedtime. (Patient taking differently: Inject 13 Units into the skin at bedtime. Patient takes 7 units now.)     Loratadine 10 MG CAPS Take 10 mg by mouth daily.      metFORMIN (GLUCOPHAGE-XR) 500 MG 24 hr tablet Take 1 tablet by mouth 2 (two) times daily.     OVER THE COUNTER MEDICATION NATURAL BLACK CHERRY CONCENTRATE     repaglinide (PRANDIN) 1 MG tablet Take 1 tablet by mouth 3 (three) times daily before meals.     Saw Palmetto 450 MG CAPS Take 1 capsule by mouth daily.     TRESIBA FLEXTOUCH 100 UNIT/ML FlexTouch Pen Inject into the skin.     metFORMIN (GLUCOPHAGE-XR) 500 MG 24 hr tablet Take 500 mg by mouth 2 (two) times daily. (Patient not taking: Reported on 10/02/2024)     omeprazole (PRILOSEC) 20 MG capsule Take by mouth. (Patient not taking: Reported on 10/02/2024)     No current facility-administered medications for this visit.     PHYSICAL EXAMINATION: ECOG PERFORMANCE STATUS: 1 - Symptomatic but completely ambulatory Vitals:   10/02/24 1109 10/02/24 1123  BP: (!) 141/56 134/61  Pulse: 63   Resp: 18   Temp: (!) 96.4 F (35.8 C)   SpO2: 99%    Filed Weights   10/02/24 1109  Weight: 136 lb 9.6 oz (62 kg)    Physical Exam Constitutional:      General: He is not in acute distress. HENT:     Head: Normocephalic and atraumatic.  Eyes:     General: No scleral icterus. Cardiovascular:     Rate and Rhythm: Normal rate.  Pulmonary:     Effort: Pulmonary effort is normal. No respiratory  distress.  Abdominal:     General: There is no distension.  Musculoskeletal:        General: No deformity. Normal range of motion.     Cervical back: Normal range of motion and neck supple.  Skin:    Findings: No rash.  Neurological:     Mental  Status: He is alert and oriented to person, place, and time. Mental status is at baseline.  Psychiatric:        Mood and Affect: Mood normal.     LABORATORY DATA:  I have reviewed the data as listed    Latest Ref Rng & Units 09/26/2024    7:26 AM 09/09/2024   10:29 AM 02/27/2024   10:29 AM  CBC  WBC 4.0 - 10.5 K/uL 5.9  8.0  6.2   Hemoglobin 13.0 - 17.0 g/dL 86.3  86.2  87.7   Hematocrit 39.0 - 52.0 % 40.0  40.8  36.0   Platelets 150 - 400 K/uL 241  221  186       Latest Ref Rng & Units 10/02/2024   11:57 AM 09/09/2024   10:29 AM 02/27/2024   10:29 AM  CMP  Glucose 70 - 99 mg/dL 811  870  837   BUN 8 - 23 mg/dL 22  24  25    Creatinine 0.61 - 1.24 mg/dL 8.73  8.70  8.77   Sodium 135 - 145 mmol/L 135  136  130   Potassium 3.5 - 5.1 mmol/L 4.6  4.2  4.3   Chloride 98 - 111 mmol/L 99  103  100   CO2 22 - 32 mmol/L 24  23  22    Calcium 8.9 - 10.3 mg/dL 89.9  9.7  8.6   Total Protein 6.5 - 8.1 g/dL  8.1  6.8   Total Bilirubin 0.0 - 1.2 mg/dL  0.9  0.7   Alkaline Phos 38 - 126 U/L  54  51   AST 15 - 41 U/L  28  27   ALT 0 - 44 U/L  23  23      RADIOGRAPHIC STUDIES: I have personally reviewed the radiological images as listed and agreed with the findings in the report. IR BONE MARROW BIOPSY & ASPIRATION Result Date: 09/26/2024 INDICATION: MGUS, concern for myeloma EXAM: FLUOROSCOPICALLY GUIDED RIGHT ILIAC BONE MARROW ASPIRATION AND CORE BIOPSY Date:  09/26/2024 09/26/2024 9:13 am Radiologist:  M. Frederic Specking, MD Guidance:  CT FLUOROSCOPY: Fluoroscopy Time: 0 minutes 48 seconds (3 mGy). MEDICATIONS: 1% LIDOCAINE LOW ANESTHESIA/SEDATION: 1.0 mg IV Versed ; 50 mcg IV Fentanyl  Moderate Sedation Time:  12 MINUTES The patient was continuously monitored during the procedure by the interventional radiology nurse under my direct supervision. CONTRAST:  NONE. COMPLICATIONS: None PROCEDURE: Informed consent was obtained from the patient following explanation of the procedure, risks, benefits  and alternatives. The patient understands, agrees and consents for the procedure. All questions were addressed. A time out was performed. The patient was positioned prone and fluoroscopic localization was performed of the pelvis to demonstrate the iliac marrow spaces. Maximal barrier sterile technique utilized including caps, mask, sterile gowns, sterile gloves, large sterile drape, hand hygiene, and Betadine prep. Under sterile conditions and local anesthesia, an 11 gauge coaxial bone biopsy needle was advanced into the right iliac marrow space. Needle position was confirmed with fluoroscopic imaging. Initially, bone marrow aspiration was performed. Next, the 11 gauge outer cannula was utilized to obtain a right iliac bone marrow core biopsy. Needle was removed. Hemostasis was  obtained with compression. The patient tolerated the procedure well. Samples were prepared with the cytotechnologist. No immediate complications. IMPRESSION: Fluoroscopy guided right iliac bone marrow aspiration and core biopsy. Electronically Signed   By: CHRISTELLA.  Shick M.D.   On: 09/26/2024 09:23   DG Bone Survey Met Result Date: 09/11/2024 EXAM: BONE SURVEY RADIOGRAPHS, MULTIPLE AP AND LATERAL VIEWS 09/03/2024 01:44:30 PM TECHNIQUE: Multiple AP and lateral xray views of the axial skeleton and AP views of the appendicular skeleton were performed. COMPARISON: None available. CLINICAL HISTORY: MGUS FINDINGS: Skull: No lytic or blastic lesions identified. Spine: No focal lytic lesions identified. Degenerative changes are seen within the lumbar spine at L3-L4 and L5-S1. Multilevel facet arthrosis noted within the cervical spine, most severe on the right at C3-C6. Advanced degenerative disc disease C6-C7. Degenerative changes noted within the thoracic spine. Chest: No lytic or blastic lesions identified. Lungs are clear. Mild right AC degenerative arthritis. Cardiac size within normal limits. Pelvis and Hips: No lytic or blastic lesions  identified. Upper Limbs: No lytic or blastic lesions identified. Vascular calcifications noted within the upper extremities bilaterally. Lower Limbs: No lytic or blastic lesions identified. Vascular calcifications noted within the lower extremities bilaterally. IMPRESSION: 1. No focal lytic lesions identified. Incidental findings as noted above. Electronically signed by: Dorethia Molt MD 09/11/2024 04:15 AM EST RP Workstation: [id number]     "

## 2024-10-03 NOTE — Assessment & Plan Note (Signed)
 Encourage oral hydration and avoid nephrotoxins.  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

## 2024-10-03 NOTE — Assessment & Plan Note (Signed)
 IgG kappa smodering multiple myeloma.  Labs are reviewed and discussed with patient. Lab Results  Component Value Date   MPROTEIN 0.8 (H) 09/09/2024   KPAFRELGTCHN 608.1 (H) 09/09/2024   LAMBDASER 34.2 (H) 09/09/2024   KAPLAMBRATIO 17.78 (H) 09/09/2024  M protein 0.8, and UPEP M protein 500, and increased light chain chatio.  He has normal hemoglobin, kidney function is borderline.  Bone marrow biopsy showed 10% of plasma cells. 09/11/24 negative skeletal survey.  Recommend PET scan whole body.  Discussed with patient about options of continue close monitor vs initiate myeloma treatments.  Will further discuss after PET scan.

## 2024-10-07 ENCOUNTER — Encounter (HOSPITAL_COMMUNITY): Payer: Self-pay

## 2024-10-09 ENCOUNTER — Encounter (HOSPITAL_COMMUNITY): Payer: Self-pay

## 2024-10-11 ENCOUNTER — Ambulatory Visit
Admission: RE | Admit: 2024-10-11 | Discharge: 2024-10-11 | Disposition: A | Discharge status: Home / Self Care | Source: Ambulatory Visit | Attending: Oncology | Admitting: Oncology

## 2024-10-11 DIAGNOSIS — I700 Atherosclerosis of aorta: Secondary | ICD-10-CM | POA: Insufficient documentation

## 2024-10-11 DIAGNOSIS — I2510 Atherosclerotic heart disease of native coronary artery without angina pectoris: Secondary | ICD-10-CM | POA: Diagnosis not present

## 2024-10-11 DIAGNOSIS — D472 Monoclonal gammopathy: Secondary | ICD-10-CM | POA: Insufficient documentation

## 2024-10-11 LAB — GLUCOSE, CAPILLARY: Glucose-Capillary: 113 mg/dL — ABNORMAL HIGH (ref 70–99)

## 2024-10-11 MED ORDER — FLUDEOXYGLUCOSE F - 18 (FDG) INJECTION
7.4600 | Freq: Once | INTRAVENOUS | Status: AC | PRN
  Administered 2024-10-11: 7.46 via INTRAVENOUS

## 2024-10-25 ENCOUNTER — Encounter: Payer: Self-pay | Admitting: Oncology

## 2024-10-25 ENCOUNTER — Inpatient Hospital Stay: Attending: Oncology | Admitting: Oncology

## 2024-10-25 VITALS — BP 155/63 | HR 62 | Resp 18 | Ht 71.0 in | Wt 135.0 lb

## 2024-10-25 DIAGNOSIS — C9000 Multiple myeloma not having achieved remission: Secondary | ICD-10-CM

## 2024-10-25 DIAGNOSIS — R590 Localized enlarged lymph nodes: Secondary | ICD-10-CM | POA: Diagnosis not present

## 2024-10-25 DIAGNOSIS — N1831 Chronic kidney disease, stage 3a: Secondary | ICD-10-CM | POA: Diagnosis not present

## 2024-10-25 NOTE — Assessment & Plan Note (Addendum)
 IgG kappa  multiple myeloma.  Lab Results  Component Value Date   MPROTEIN 0.8 (H) 09/09/2024   KPAFRELGTCHN 608.1 (H) 09/09/2024   LAMBDASER 34.2 (H) 09/09/2024   KAPLAMBRATIO 17.78 (H) 09/09/2024  M protein 0.8, and UPEP M protein 500, free kappa light chain 600 08 and increased light chain chatio.  He has normal hemoglobin, decreased kidney function.  Bone marrow biopsy showed 10% of plasma cells.  No bone lesions on skeletal survey and PET scan. Patient appears to start to have renal impairment secondary to increased light chain. Patient and potential side effects of kidney biopsy was reviewed and discussed with patient.-Declined Clinically, patient is borderline active multiple myeloma due to likely renal involvement due to increased light chain. Options of treatment chemotherapy treatments now versus close monitor every 3 months and attribute treatment if kidney function further decreases were reviewed and discussed with patient and his son-in-law.  Rationale potential side effects of multiple myeloma chemotherapy treatments were reviewed. Patient is undecided and would like to further discuss with family members and update me.  Addendum, patient called and wants to proceed with treatment. Plan [address] Will arrange chemotherapy class and Revlimid .

## 2024-10-25 NOTE — Assessment & Plan Note (Signed)
 Encourage oral hydration and avoid nephrotoxins.  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

## 2024-10-25 NOTE — Progress Notes (Addendum)
 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  Lab Results  Component Value Date   MPROTEIN 0.8 (H) 09/09/2024   KPAFRELGTCHN 608.1 (H) 09/09/2024   LAMBDASER 34.2 (H) 09/09/2024   KAPLAMBRATIO 17.78 (H) 09/09/2024  M protein 0.8, and UPEP M protein 500, free kappa light chain 600 08 and increased light chain chatio.  He has normal hemoglobin, decreased kidney function.  Bone marrow biopsy showed 10% of plasma cells.  No bone lesions on skeletal survey and PET scan. Patient appears to start to have renal impairment secondary to increased light chain. Patient and potential side effects of kidney biopsy was reviewed and discussed with patient.-Declined Clinically, patient is borderline active multiple myeloma due to likely renal involvement due to increased light chain. Options of treatment chemotherapy treatments now versus close monitor every 3 months and attribute treatment if kidney function further decreases were reviewed and discussed with patient and his son-in-law.  Rationale potential side effects of multiple myeloma chemotherapy treatments were reviewed. Patient is undecided and would like to further discuss with family members and update me.  Addendum, patient called and wants to proceed with treatment. Plan [address] Will arrange chemotherapy class and Revlimid .    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  This could be secondary to acute/chronic inflammation versus malignancy. I recommend close monitoring and repeat imaging for  surveillance in 6 months.   No orders of the defined types were placed in this encounter.  Follow up to be determined.  Patient will call back office and update his decision. All questions were answered. The patient knows to call the clinic with any problems, questions or concerns.  Call Babara, MD, PhD [facility] Hematology Oncology 10/25/2024    CHIEF COMPLAINTS/REASON FOR VISIT:  Follow up for multiple myeloma  HISTORY OF PRESENTING ILLNESS:  Patient recently was seen by neurology for left leg weakness, back pain, diabetic polyneuropathy.  He had work up done.  08/20/2021 SPEP showed M spike of 0.5g/dl, IgG kappa.  09/14/2021 Free kappa light chain 132.9, lamda light chain 36.9, kappa/lamda ratio 3.6 He was referred to hematology for further evaluation.    INTERVAL HISTORY  Oncology History  Multiple myeloma (HCC)  10/07/2021 Initial Diagnosis   Smoldering multiple myeloma   09/26/2024 Bone Marrow Biopsy   BONE MARROW, ASPIRATE, CLOT, CORE:  - Variably cellular bone marrow (10-70%) involved by plasma cell  neoplasm (5% plasma cells by manual aspirate differential, approximately up to 10% by CD138 immunohistochemical analysis, and kappa monotypic by kappa/lambda in situ hybridization).   Assessment for plasma cells is somewhat limited by a paucicellular bone  marrow aspirate and aspiration artifact on the bone marrow biopsy.   PERIPHERAL BLOOD:  - No significant pathologic changes    10/03/2024 Cancer Staging   Staging form: Multiple Myeloma, AJCC 6th Edition - Clinical stage from 10/03/2024: Stage Unknown - Signed by Babara Call, MD on 10/03/2024 Staged by: Managing physician Stage prefix: Initial diagnosis   10/19/2024 Imaging   PET scan showed 1. Small mildly hypermetabolic mediastinal and right hilar lymph nodes. Otherwise, no abnormal hypermetabolism in the  neck, chest, abdomen or pelvis. 2. Aortic atherosclerosis (ICD10-I70.0). Coronary artery calcification      Patient presents to discuss results and management plan.  Accompanied by son-in-law.  Review of Systems  Constitutional:  Negative for appetite change, chills, fatigue, fever and unexpected weight change.  HENT:   Negative for hearing loss and voice change.   Eyes:  Negative for eye problems and icterus.  Respiratory:  Negative for chest tightness, cough and shortness of breath.   Cardiovascular:  Negative for chest pain and leg swelling.  Gastrointestinal:  Negative for abdominal distention and abdominal pain.  Endocrine: Negative for hot flashes.  Genitourinary:  Negative for difficulty urinating, dysuria and frequency.   Musculoskeletal:  Negative for arthralgias.  Skin:  Negative for itching and rash.  Neurological:  Positive for extremity weakness. Negative for light-headedness and numbness.  Hematological:  Negative for adenopathy. Does not bruise/bleed easily.  Psychiatric/Behavioral:  Negative for confusion.     MEDICAL HISTORY:  Past Medical History:  Diagnosis Date   Cataract extraction status of right eye    CKD (chronic kidney disease)    Diabetes mellitus without complication (HCC)    Hyperlipidemia    Hypertension    Hypertension associated with diabetes (HCC)    Migraine without status migrainosus, not intractable    Other seasonal allergic rhinitis    Puckering of macula    Vitreous degeneration of left eye     SURGICAL HISTORY: Past Surgical History:  Procedure Laterality Date   CATARACT EXTRACTION, BILATERAL     IR BONE MARROW BIOPSY & ASPIRATION  09/26/2024   Left Ring and Long Trigger Finger Release     TONSILLECTOMY     TONSILLECTOMY AND ADENOIDECTOMY     TOOTH EXTRACTION      SOCIAL HISTORY: Social History   Socioeconomic History   Marital status: Married    Spouse name: Not on file   Number of children: Not on file   Years of education: Not on file   Highest education level: Not on file  Occupational History   Not on file  Tobacco Use    Smoking status: Never   Smokeless tobacco: Never  Vaping Use   Vaping status: Never Used  Substance and Sexual Activity   Alcohol use: Never   Drug use: Never   Sexual activity: Not on file  Other Topics Concern   Not on file  Social History Narrative   Not on file   Social Drivers of Health   Tobacco Use: Low Risk (10/25/2024)   Patient History    Smoking Tobacco Use: Never    Smokeless Tobacco Use: Never    Passive Exposure: Not on file  Financial Resource Strain: Low Risk  (08/28/2024)   Received from [facility]   Overall Financial Resource Strain (CARDIA)    Difficulty of Paying Living Expenses: Not hard at all  Food Insecurity: No Food Insecurity (08/28/2024)   Received from [facility]   Epic    Within the past 12 months, you worried that your food would run out before you got the money to buy more.: Never true    Within the past 12 months, the food you bought just didn't last and you didn't have money to get more.: Never true  Transportation Needs: No Transportation Needs (08/28/2024)   Received from [facility] - Transportation    In the past 12 months, has lack of transportation kept you from medical appointments  or from getting medications?: No    Lack of Transportation (Non-Medical): No  Physical Activity: Not on file  Stress: Not on file  Social Connections: Not on file  Intimate Partner Violence: Not on file  Depression (PHQ2-9): Low Risk (10/25/2024)   Depression (PHQ2-9)    PHQ-2 Score: 0  Alcohol Screen: Not on file  Housing: Low Risk  (08/28/2024)   Received from [facility]   Epic    In the last 12 months, was there a time when you were not able to pay the mortgage or rent on time?: No    In the past 12 months, how many times have you moved where you were living?: 0    At any time in the past 12 months, were you homeless or living in a shelter (including now)?: No   Utilities: Not At Risk (08/28/2024)   Received from [facility]   Epic    In the past 12 months has the electric, gas, oil, or water company threatened to shut off services in your home?: No  Health Literacy: Not on file    FAMILY HISTORY: Family History  Problem Relation Age of Onset   Diabetes Mother    Parkinson's disease Mother    Heart failure Father    Kidney disease Father    Rheum arthritis Sister    Diabetes Brother    Heart failure Brother    Arthritis Brother    Diabetes Other        multiple cousins with diabetes    ALLERGIES:  has no known allergies.  MEDICATIONS:  Current Outpatient Medications  Medication Sig Dispense Refill   aspirin EC 81 MG tablet Take 81 mg by mouth daily.      atorvastatin (LIPITOR) 20 MG tablet Take 20 mg by mouth daily.      benazepril (LOTENSIN) 20 MG tablet Take 20 mg by mouth daily.      Calcium Carbonate-Vit D-Min (CALCIUM 1200 PO) Take 1 tablet by mouth 3 (three) times a week.     cyanocobalamin  (VITAMIN B12) 1000 MCG tablet Take 1,000 mcg by mouth daily.     Dulaglutide 0.75 MG/0.5ML SOPN Inject 0.75 mg into the skin once a week.      famotidine (PEPCID) 20 MG tablet Take 20 mg by mouth daily.     fluticasone (FLONASE) 50 MCG/ACT nasal spray Place 1 spray into both nostrils daily.     glucosamine-chondroitin 500-400 MG tablet Take 1 tablet by mouth daily.      Insulin Degludec (TRESIBA FLEXTOUCH [place]) Inject 13 Units into the skin at bedtime. (Patient taking differently: Inject 13 Units into the skin at bedtime. Patient takes 7 units now.)     Loratadine 10 MG CAPS Take 10 mg by mouth daily.      metFORMIN (GLUCOPHAGE-XR) 500 MG 24 hr tablet Take 1 tablet by mouth 2 (two) times daily.     OVER THE COUNTER MEDICATION NATURAL BLACK CHERRY CONCENTRATE     repaglinide (PRANDIN) 1 MG tablet Take 1 tablet by mouth 3 (three) times daily before meals.     Saw Palmetto 450 MG CAPS Take 1 capsule by mouth daily.      TRESIBA FLEXTOUCH 100 UNIT/ML FlexTouch Pen Inject into the skin.     metFORMIN (GLUCOPHAGE-XR) 500 MG 24 hr tablet Take 500 mg by mouth 2 (two) times daily. (Patient not taking: Reported on 10/25/2024)     omeprazole (PRILOSEC) 20 MG capsule Take by mouth. (  Patient not taking: Reported on 10/25/2024)     No current facility-administered medications for this visit.     PHYSICAL EXAMINATION: ECOG PERFORMANCE STATUS: 1 - Symptomatic but completely ambulatory Vitals:   10/25/24 0959  BP: (!) 155/63  Pulse: 62  Resp: 18  SpO2: 100%   Filed Weights   10/25/24 0959  Weight: 135 lb (61.2 kg)    Physical Exam Constitutional:      General: He is not in acute distress. HENT:     Head: Normocephalic and atraumatic.  Eyes:     General: No scleral icterus. Cardiovascular:     Rate and Rhythm: Normal rate.  Pulmonary:     Effort: Pulmonary effort is normal. No respiratory distress.  Abdominal:     General: There is no distension.  Musculoskeletal:        General: No deformity. Normal range of motion.     Cervical back: Normal range of motion and neck supple.  Skin:    Findings: No rash.  Neurological:     Mental Status: He is alert and oriented to person, place, and time. Mental status is at baseline.  Psychiatric:        Mood and Affect: Mood normal.     LABORATORY DATA:  I have reviewed the data as listed    Latest Ref Rng & Units 09/26/2024    7:26 AM 09/09/2024   10:29 AM 02/27/2024   10:29 AM  CBC  WBC 4.0 - 10.5 K/uL 5.9  8.0  6.2   Hemoglobin 13.0 - 17.0 g/dL 86.3  86.2  87.7   Hematocrit 39.0 - 52.0 % 40.0  40.8  36.0   Platelets 150 - 400 K/uL 241  221  186       Latest Ref Rng & Units 10/02/2024   11:57 AM 09/09/2024   10:29 AM 02/27/2024   10:29 AM  CMP  Glucose 70 - 99 mg/dL 811  870  837   BUN 8 - 23 mg/dL 22  24  25    Creatinine 0.61 - 1.24 mg/dL 8.73  8.70  8.77   Sodium 135 - 145 mmol/L 135  136  130   Potassium 3.5 - 5.1 mmol/L 4.6  4.2  4.3    Chloride 98 - 111 mmol/L 99  103  100   CO2 22 - 32 mmol/L 24  23  22    Calcium 8.9 - 10.3 mg/dL 89.9  9.7  8.6   Total Protein 6.5 - 8.1 g/dL  8.1  6.8   Total Bilirubin 0.0 - 1.2 mg/dL  0.9  0.7   Alkaline Phos 38 - 126 U/L  54  51   AST 15 - 41 U/L  28  27   ALT 0 - 44 U/L  23  23      RADIOGRAPHIC STUDIES: I have personally reviewed the radiological images as listed and agreed with the findings in the report. NM PET Image Initial (PI) Whole Body Result Date: 10/19/2024 CLINICAL DATA:  Initial treatment strategy for monoclonal gammopathy of unknown significance. EXAM: NUCLEAR MEDICINE PET WHOLE BODY TECHNIQUE: 7.5 mCi F-18 FDG was injected intravenously. Full-ring PET imaging was performed from the head to foot after the radiotracer. CT data was obtained and used for attenuation correction and anatomic localization. Fasting blood glucose: 113 mg/dl COMPARISON:  None Available. FINDINGS: Mediastinal blood pool activity: SUV max 2.5 HEAD/NECK: No abnormal hypermetabolism. Incidental CT findings: Cerebral atrophy. CHEST: Mild subcarinal and right hilar hypermetabolism. Index subcarinal lymph  node measures approximately 10 mm, SUV max 3.4. No additional abnormal hypermetabolism. Incidental CT findings: Atherosclerotic calcification of the aorta and coronary arteries. Heart size normal. No pericardial effusion. No pericardial or pleural effusion. Debris in the airway. ABDOMEN/PELVIS: No abnormal hypermetabolism. Minimal focal are metabolism in the left adrenal gland without a CT correlate. Incidental CT findings: Tiny cyst in the dome of the liver. SKELETON: Focal metabolism in the left manubrium does not measure above blood pool. No abnormal hypermetabolism. Incidental CT findings: Degenerative changes in the spine. EXTREMITIES: No abnormal hypermetabolism. Incidental CT findings: None. IMPRESSION: 1. Small mildly hypermetabolic mediastinal and right hilar lymph nodes. Otherwise, no abnormal  hypermetabolism in the neck, chest, abdomen or pelvis. 2. Aortic atherosclerosis (ICD10-I70.0). Coronary artery calcification. Electronically Signed   By: Newell Eke M.D.   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 Frederic Specking, MD Guidance:  CT FLUOROSCOPY: Fluoroscopy Time: 0 minutes 48 seconds (3 mGy). MEDICATIONS: 1% LIDOCAINE LOW ANESTHESIA/SEDATION: 1.0 mg IV Versed ; 50 mcg IV Fentanyl  Moderate Sedation Time:  12 MINUTES The patient was continuously monitored during the procedure by the interventional radiology nurse under my direct supervision. CONTRAST:  NONE. COMPLICATIONS: None PROCEDURE: Informed consent was obtained from the patient following explanation of the procedure, risks, benefits and alternatives. The patient understands, agrees and consents for the procedure. All questions were addressed. A time out was performed. The patient was positioned prone and fluoroscopic localization was performed of the pelvis to demonstrate the iliac marrow spaces. Maximal barrier sterile technique utilized including caps, mask, sterile gowns, sterile gloves, large sterile drape, hand hygiene, and Betadine prep. Under sterile conditions and local anesthesia, an 11 gauge coaxial bone biopsy needle was advanced into the right iliac marrow space. Needle position was confirmed with fluoroscopic imaging. Initially, bone marrow aspiration was performed. Next, the 11 gauge outer cannula was utilized to obtain a right iliac bone marrow core biopsy. Needle was removed. Hemostasis was obtained with compression. The patient tolerated the procedure well. Samples were prepared with the cytotechnologist. No immediate complications. IMPRESSION: Fluoroscopy guided right iliac bone marrow aspiration and core biopsy.  Electronically Signed   By: CHRISTELLA.  Shick M.D.   On: 09/26/2024 09:23     "

## 2024-10-25 NOTE — Assessment & Plan Note (Signed)
 Nonspecific.  This could be secondary to acute/chronic inflammation versus malignancy. I recommend close monitoring and repeat imaging for surveillance in 6 months.

## 2024-10-31 ENCOUNTER — Telehealth: Payer: Self-pay

## 2024-10-31 NOTE — Telephone Encounter (Signed)
 Received VM from pt stating that he would like to proceed with tx for Myeloma.

## 2024-11-01 ENCOUNTER — Telehealth: Payer: Self-pay | Admitting: Pharmacy Technician

## 2024-11-01 ENCOUNTER — Encounter: Payer: Self-pay | Admitting: Oncology

## 2024-11-01 ENCOUNTER — Other Ambulatory Visit: Payer: Self-pay | Admitting: Oncology

## 2024-11-01 ENCOUNTER — Other Ambulatory Visit (HOSPITAL_COMMUNITY): Payer: Self-pay

## 2024-11-01 DIAGNOSIS — C9000 Multiple myeloma not having achieved remission: Secondary | ICD-10-CM

## 2024-11-01 MED ORDER — PROCHLORPERAZINE MALEATE 10 MG PO TABS: 10.0000 mg | ORAL_TABLET | Freq: Four times a day (QID) | ORAL | 1 refills | Status: AC | PRN

## 2024-11-01 MED ORDER — ONDANSETRON HCL 8 MG PO TABS: 8.0000 mg | ORAL_TABLET | Freq: Three times a day (TID) | ORAL | 1 refills | Status: AC | PRN

## 2024-11-01 MED ORDER — ACYCLOVIR 400 MG PO TABS: 400.0000 mg | ORAL_TABLET | Freq: Two times a day (BID) | ORAL | 11 refills | Status: AC

## 2024-11-01 NOTE — Telephone Encounter (Signed)
 Oral Oncology Patient Advocate Encounter  Prior Authorization for lenalidomide  has been approved.    PA# [id number] Effective dates: 10/10/2024 through 10/09/2025  Patients co-pay is $100.    Terry Vazquez (Terry Vazquez) Chet Burnet, CPhT  [facility], [place], [facility] H[facility]ology/Oncology - Oral Chemotherapy Patient Advocate Specialist III Phone: [phone]  Fax: [phone]

## 2024-11-01 NOTE — Telephone Encounter (Signed)
 Per MD secure chat- schedule pt for chemo education.  I called and spoke with pt and confirmed the education class on 2/5. Pt stated he will bring his son with him.

## 2024-11-01 NOTE — Progress Notes (Signed)
 START ON PATHWAY REGIMEN - Multiple Myeloma and Other Plasma Cell Dyscrasias     Cycles 1 and 2: A cycle is every 28 days:     Lenalidomide       Dexamethasone      Daratumumab and hyaluronidase-fihj    Cycles 3 through 6: A cycle is every 28 days:     Lenalidomide       Dexamethasone      Daratumumab and hyaluronidase-fihj    Cycles 7 and beyond: A cycle is every 28 days:     Lenalidomide       Dexamethasone      Daratumumab and hyaluronidase-fihj   **Always confirm dose/schedule in your pharmacy ordering system**  Patient Characteristics: Multiple Myeloma, Newly Diagnosed, Transplant Ineligible or Deferred, Unknown Risk or Awaiting Test Results Disease Classification: Multiple Myeloma Therapeutic Status: Newly Diagnosed R2-ISS Staging: Awaiting Test Results Is Patient Eligible for Transplant<= Transplant Ineligible or Deferred Risk Status: Awaiting Test Results Intent of Therapy: Non-Curative / Palliative Intent, Discussed with Patient

## 2024-11-01 NOTE — Addendum Note (Signed)
 Addended by: BABARA CALL on: 11/01/2024 02:38 PM   Modules accepted: Orders

## 2024-11-01 NOTE — Telephone Encounter (Signed)
 Oral Oncology Patient Advocate Encounter   New authorization  Member ID: [id number]   Received notification that prior authorization for Lenalidomide is required.   PA submitted on CMM via Latent Key [id number] Status is pending     Terry Vazquez (Patty) Chet Burnet, CPhT  [facility], [place], [facility] - Oral Chemotherapy Patient Advocate Specialist III Phone: [phone]  Fax: [phone]

## 2024-11-02 ENCOUNTER — Other Ambulatory Visit: Payer: Self-pay

## 2024-11-04 ENCOUNTER — Telehealth: Payer: Self-pay | Admitting: Pharmacy Technician

## 2024-11-04 ENCOUNTER — Telehealth: Payer: Self-pay | Admitting: Pharmacist

## 2024-11-04 ENCOUNTER — Other Ambulatory Visit (HOSPITAL_COMMUNITY): Payer: Self-pay

## 2024-11-04 DIAGNOSIS — C9000 Multiple myeloma not having achieved remission: Secondary | ICD-10-CM

## 2024-11-04 MED ORDER — LENALIDOMIDE 10 MG PO CAPS: 10.0000 mg | ORAL_CAPSULE | Freq: Every day | ORAL | 0 refills | Status: AC

## 2024-11-04 NOTE — Telephone Encounter (Signed)
 Clinical Pharmacist Practitioner Encounter   Received new prescription for Revlimid  (lenalidomide ) for the treatment of multiple myeloma in conjunction with daratumumab and dexamethasone, planned duration until disease control or unacceptable drug toxicity. Planned start 11/18/24.   CMP from 09/09/24 assessed, SCr 1.29 and CrCl ~85ml/min Prescription dose and frequency assessed. MD has adjusted dose for patient's renal function.   Current medication list in Epic reviewed, no DDIs with lenalidomide  identified.  Evaluated chart and no patient barriers to medication adherence identified.   Prescription has been e-scribed to [facility]  Oral Oncology Clinic will continue to follow for insurance authorization, copayment issues, initial counseling and start date.   Aviendha Azbell N. Corey Caulfield, PharmD, BCOP, CPP Hematology/Oncology Clinical Pharmacist [facility] Oral Chemotherapy Navigation Clinic [phone]  11/04/2024 10:03 AM

## 2024-11-04 NOTE — Telephone Encounter (Signed)
 Oral Oncology Patient Advocate Encounter  Was successful in securing patient a $8,000 grant from [facility] to provide copayment coverage for lenalidomide .  This will keep the out of pocket expense at $0.     [employer] ID: [id number]   The billing information is as follows and has been shared with appropriate pharmacy.    RxBin: [id number] PCN: [id number] Member ID: [id number] Group ID: [id number] Dates of Eligibility: 10/05/2025 through 10/04/2025  Fund:  Multiple Myeloma - Medicare Access  R[place]ate (Patty) Chet Burnet, CPhT  [facility], [place], [facility] - Oral Chemotherapy Patient Advocate Specialist III Phone: [phone]  Fax: [phone]

## 2024-11-06 ENCOUNTER — Other Ambulatory Visit: Payer: Self-pay | Admitting: Oncology

## 2024-11-06 ENCOUNTER — Telehealth: Payer: Self-pay | Admitting: Oncology

## 2024-11-06 NOTE — Telephone Encounter (Signed)
 Patient called and wanted to change his appts from 2/9 to 2/12. He said that his son comes with him and Thursdays are the days that work best for him. I told him I would reach out to the team and we would call back afterwards. Please advise

## 2024-11-07 ENCOUNTER — Other Ambulatory Visit: Payer: Self-pay

## 2024-11-08 NOTE — Telephone Encounter (Signed)
 Per [facility] portal [facility] dispensed med on 11/07/24

## 2024-11-14 ENCOUNTER — Inpatient Hospital Stay: Attending: Oncology

## 2024-11-15 NOTE — Progress Notes (Unsigned)
 Pharmacist Chemotherapy Monitoring - Initial Assessment    Anticipated start date: 11/21/24   The following has been reviewed per standard work regarding the patient's treatment regimen: The patient's diagnosis, treatment plan and drug doses, and organ/hematologic function Lab orders and baseline tests specific to treatment regimen  The treatment plan start date, drug sequencing, and pre-medications Prior authorization status  Patient's documented medication list, including drug-drug interaction screen and prescriptions for anti-emetics and supportive care specific to the treatment regimen The drug concentrations, fluid compatibility, administration routes, and timing of the medications to be used The patient's access for treatment and lifetime cumulative dose history, if applicable  The patient's medication allergies and previous infusion related reactions, if applicable   Changes made to treatment plan:  N/A Order placed for iv darzalex   MD messaged to change to sq darzalex + revlamid    Staley Budzinski J Tyberius Ryner, RPH, 11/15/2024  3:33 PM

## 2024-11-18 ENCOUNTER — Inpatient Hospital Stay

## 2024-11-18 ENCOUNTER — Inpatient Hospital Stay: Admitting: Oncology

## 2024-11-18 ENCOUNTER — Inpatient Hospital Stay: Admitting: Pharmacist

## 2024-11-21 ENCOUNTER — Inpatient Hospital Stay: Admitting: Oncology

## 2024-11-21 ENCOUNTER — Inpatient Hospital Stay

## 2024-11-21 ENCOUNTER — Inpatient Hospital Stay: Admitting: Pharmacist

## 2024-11-25 ENCOUNTER — Inpatient Hospital Stay: Admitting: Oncology

## 2024-11-25 ENCOUNTER — Inpatient Hospital Stay

## 2024-11-28 ENCOUNTER — Inpatient Hospital Stay

## 2024-11-28 ENCOUNTER — Inpatient Hospital Stay: Admitting: Oncology

## 2024-12-02 ENCOUNTER — Inpatient Hospital Stay

## 2024-12-02 ENCOUNTER — Inpatient Hospital Stay: Admitting: Oncology

## 2024-12-05 ENCOUNTER — Inpatient Hospital Stay: Admitting: Oncology

## 2024-12-05 ENCOUNTER — Inpatient Hospital Stay

## 2024-12-09 ENCOUNTER — Inpatient Hospital Stay

## 2024-12-09 ENCOUNTER — Inpatient Hospital Stay: Admitting: Oncology

## 2024-12-12 ENCOUNTER — Inpatient Hospital Stay: Admitting: Oncology

## 2024-12-12 ENCOUNTER — Inpatient Hospital Stay
